# Patient Record
Sex: Female | Born: 1975 | Race: White | Hispanic: No | Marital: Single | Smoking: Current every day smoker
Health system: Southern US, Community
[De-identification: ages and names within clinical notes are randomized; demographics above are authoritative.]

## PROBLEM LIST (undated history)

## (undated) DIAGNOSIS — F988 Other specified behavioral and emotional disorders with onset usually occurring in childhood and adolescence: Secondary | ICD-10-CM

## (undated) HISTORY — DX: Other specified behavioral and emotional disorders with onset usually occurring in childhood and adolescence: F98.8

---

## 1998-04-05 ENCOUNTER — Emergency Department (HOSPITAL_COMMUNITY): Admission: EM | Admit: 1998-04-05 | Discharge: 1998-04-05 | Payer: Self-pay | Admitting: Emergency Medicine

## 2002-08-01 ENCOUNTER — Encounter: Payer: Self-pay | Admitting: *Deleted

## 2002-08-01 ENCOUNTER — Inpatient Hospital Stay (HOSPITAL_COMMUNITY): Admission: AD | Admit: 2002-08-01 | Discharge: 2002-08-01 | Payer: Self-pay | Admitting: *Deleted

## 2002-08-02 ENCOUNTER — Ambulatory Visit (HOSPITAL_COMMUNITY): Admission: RE | Admit: 2002-08-02 | Discharge: 2002-08-02 | Payer: Self-pay | Admitting: Obstetrics and Gynecology

## 2007-01-10 ENCOUNTER — Inpatient Hospital Stay (HOSPITAL_COMMUNITY): Admission: AD | Admit: 2007-01-10 | Discharge: 2007-01-27 | Payer: Self-pay | Admitting: Family Medicine

## 2008-06-13 IMAGING — US US OB COMP +14 WK
1 series · 14 of 28 positions shown · non-contrast
Comparison: none

OBSTETRICAL ULTRASOUND:

 This ultrasound exam was performed in the [HOSPITAL] Ultrasound Department.  The OB US report was generated in the AS system, and faxed to the ordering physician.  This report is also available in [REDACTED] PACS.

[Series 1: us ob comp +14 wk · 14 of 48 slices shown]
[im 2/48]
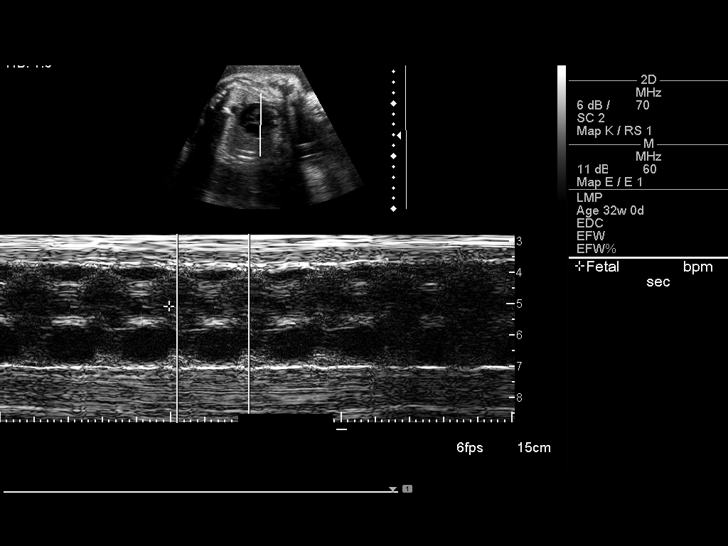
[im 6/48]
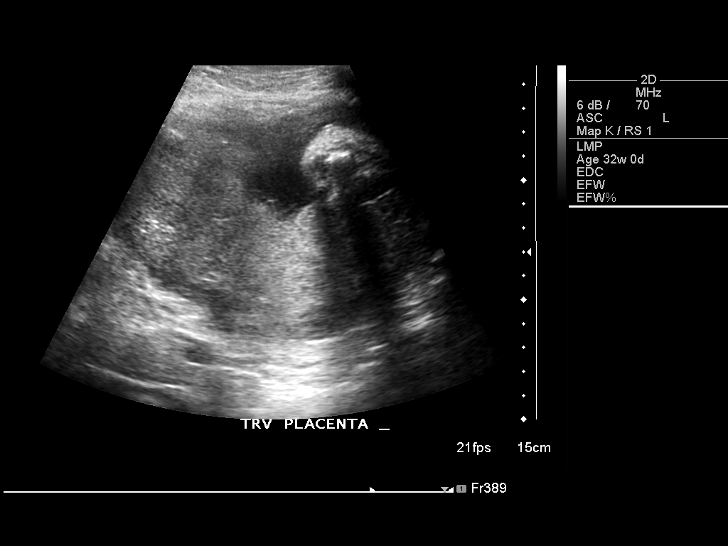
[im 9/48]
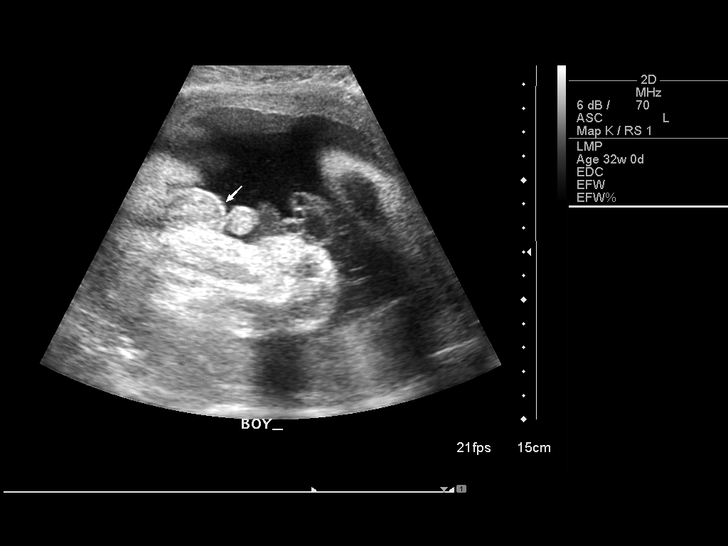
[im 13/48]
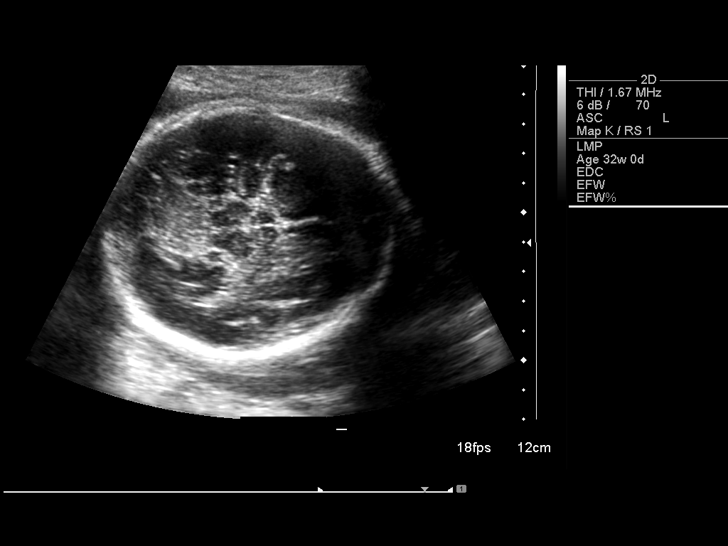
[im 16/48]
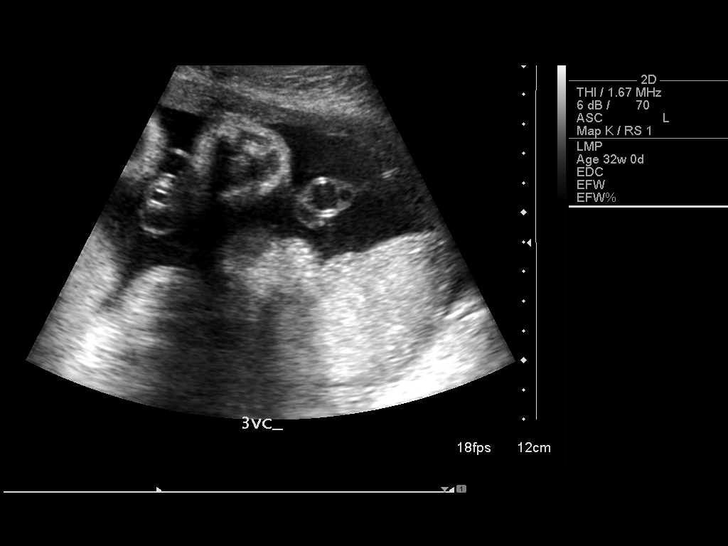
[im 20/48]
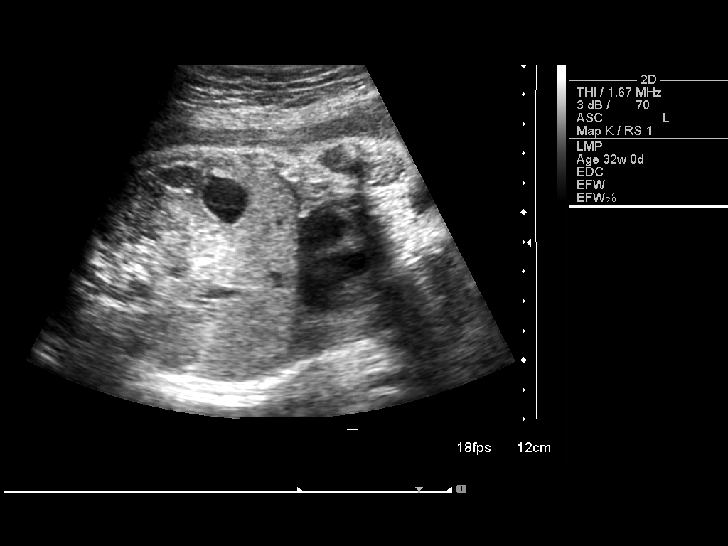
[im 23/48]
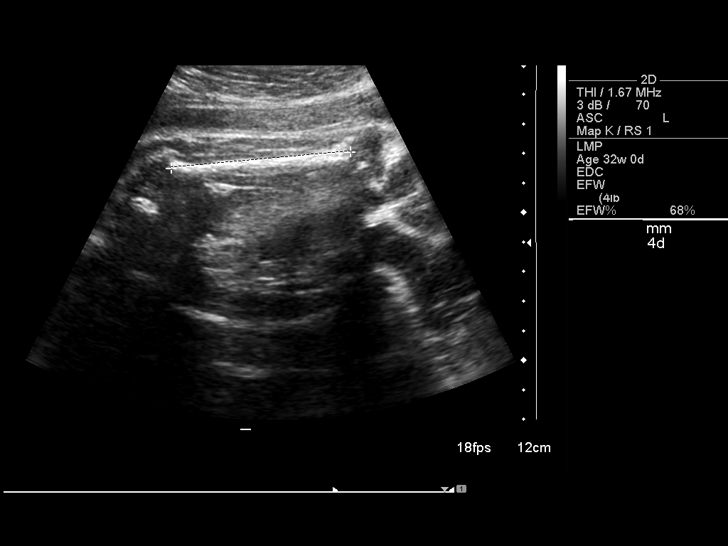
[im 27/48]
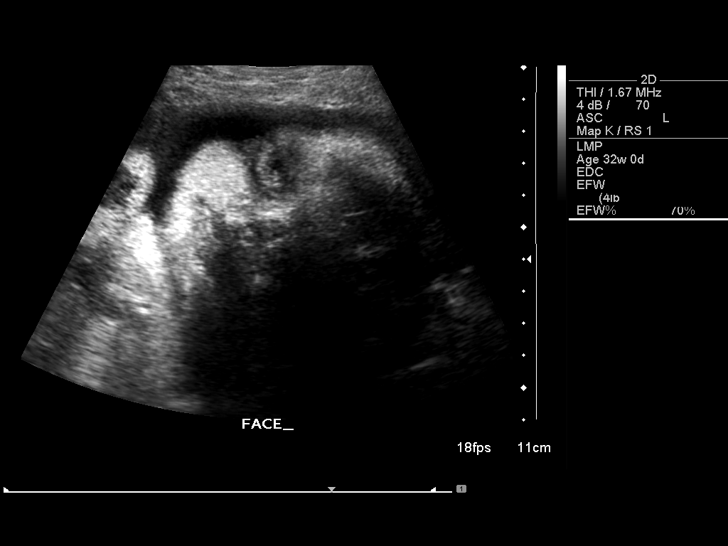
[im 30/48]
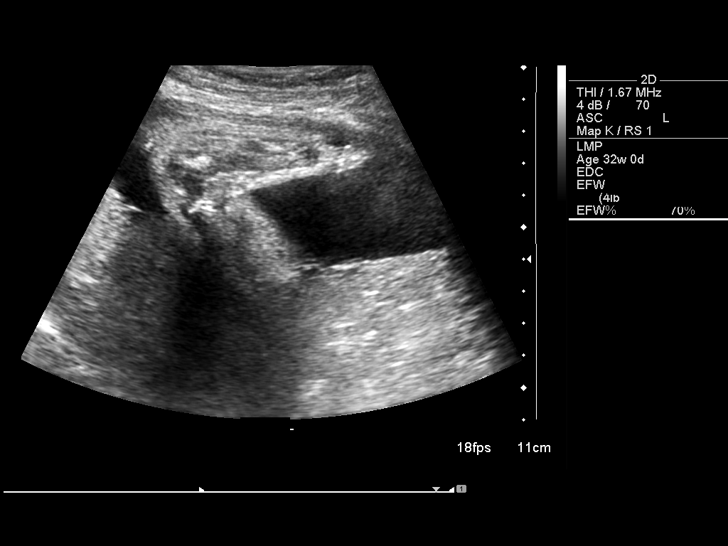
[im 34/48]
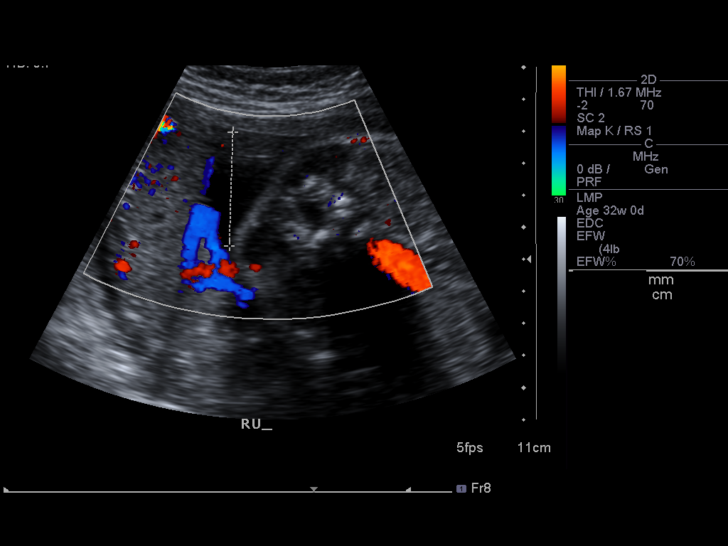
[im 37/48]
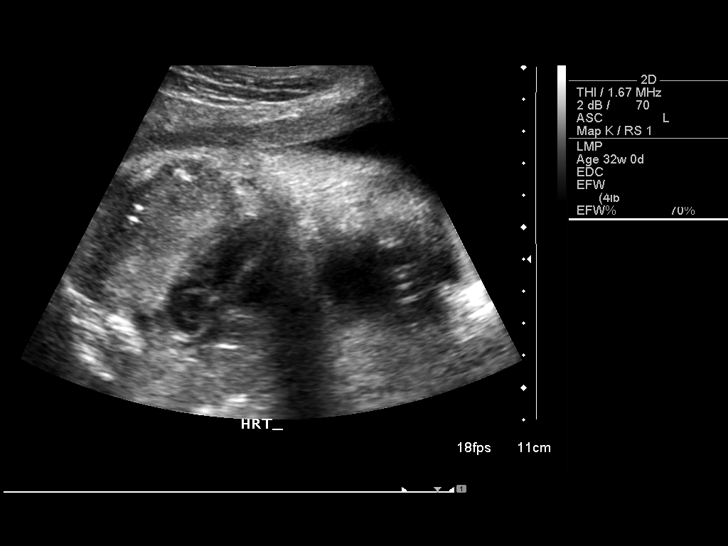
[im 41/48]
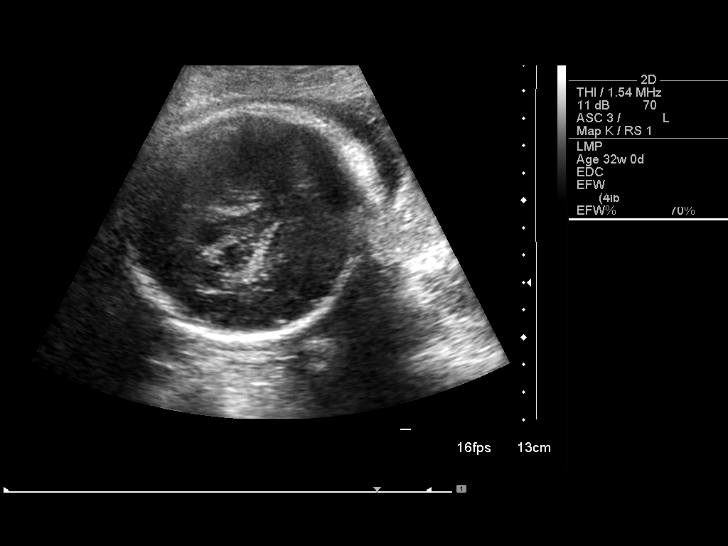
[im 44/48]
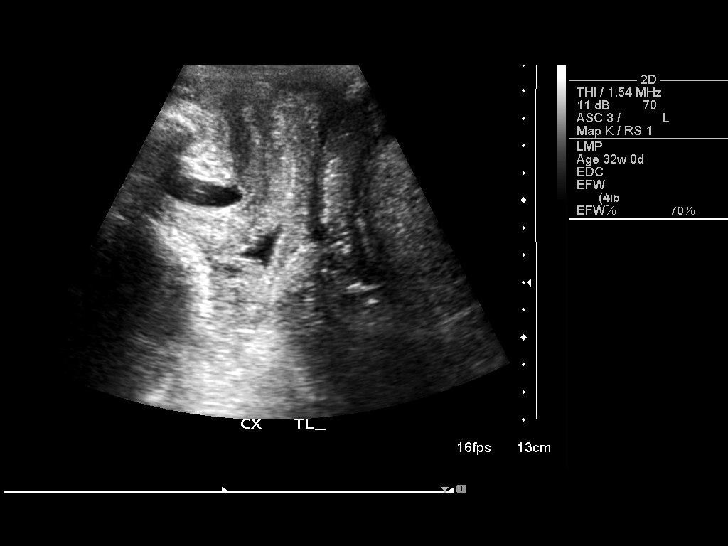
[im 48/48]
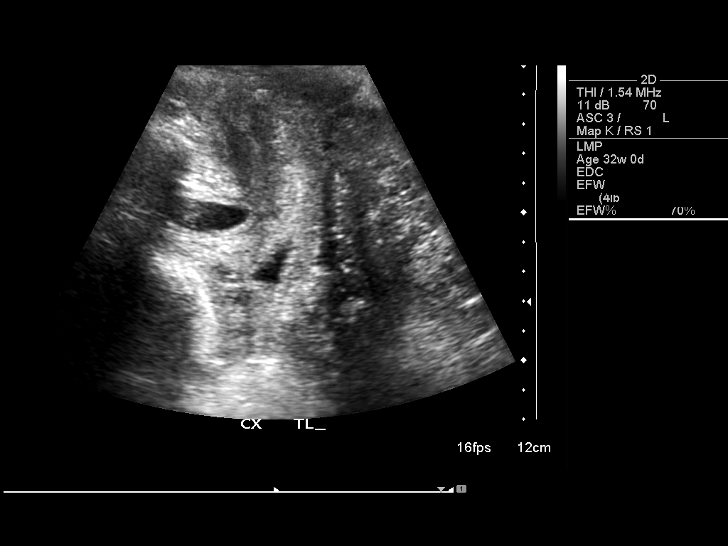

[14 of 28 positions shown; findings below may reference images not displayed]

IMPRESSION: See AS Obstetric US report.

## 2008-06-17 IMAGING — US US OB LIMITED
1 series · 14 of 28 positions shown · non-contrast
Comparison: none

OBSTETRICAL ULTRASOUND:

 This ultrasound exam was performed in the [HOSPITAL] Ultrasound Department.  The OB US report was generated in the AS system, and faxed to the ordering physician.  This report is also available in [REDACTED] PACS.

[Series 1: us ob limited · 0.26mm/px · 14 of 30 slices shown]
[im 2/30]
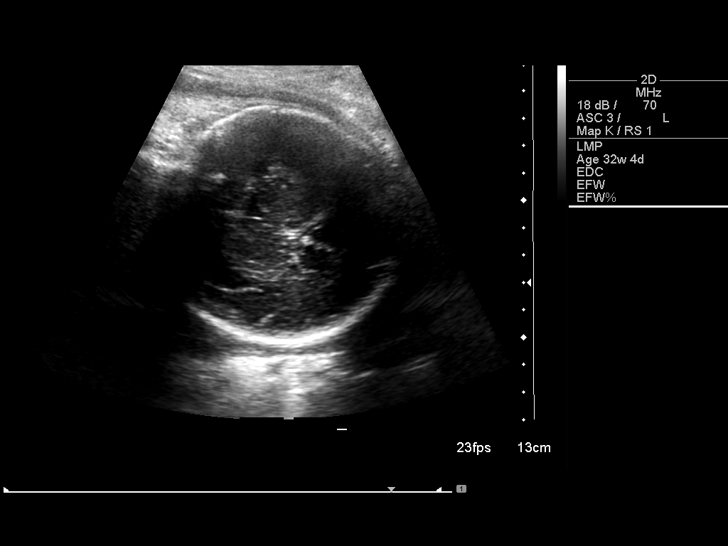
[im 4/30]
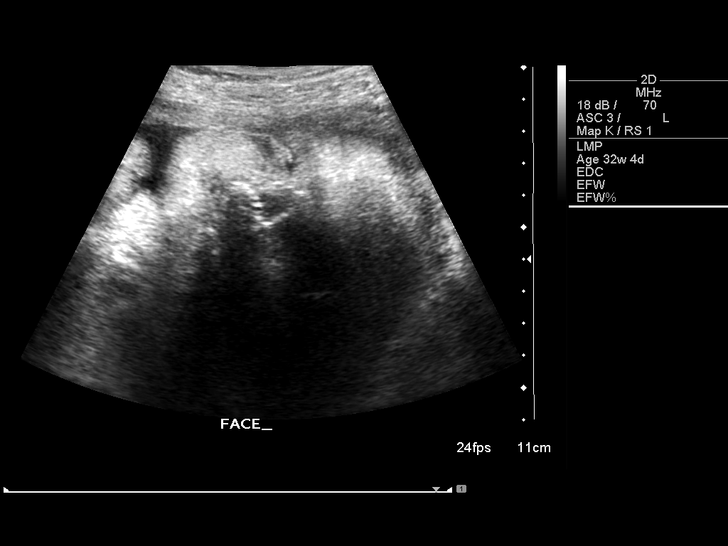
[im 6/30]
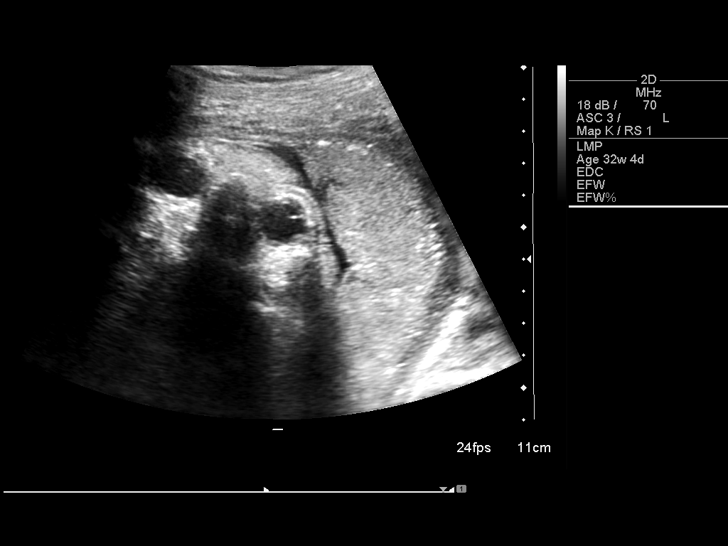
[im 8/30]
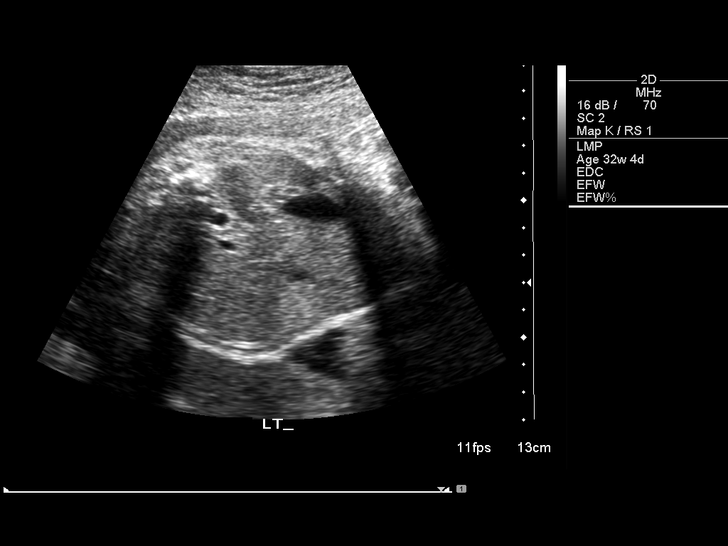
[im 10/30]
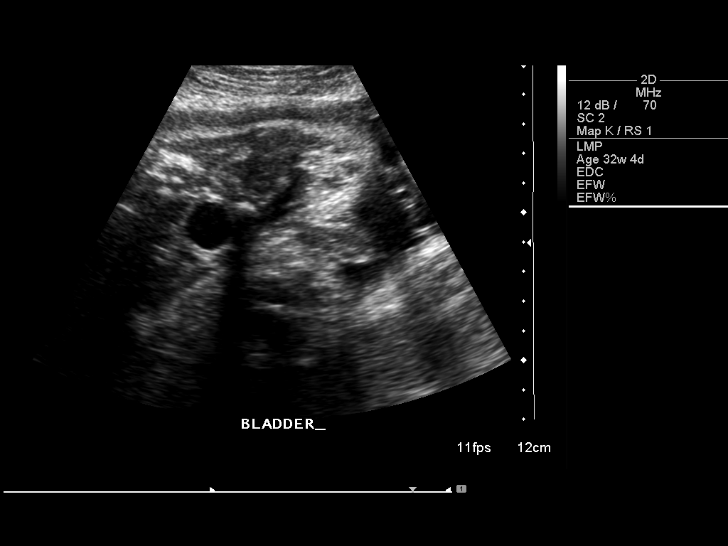
[im 12/30]
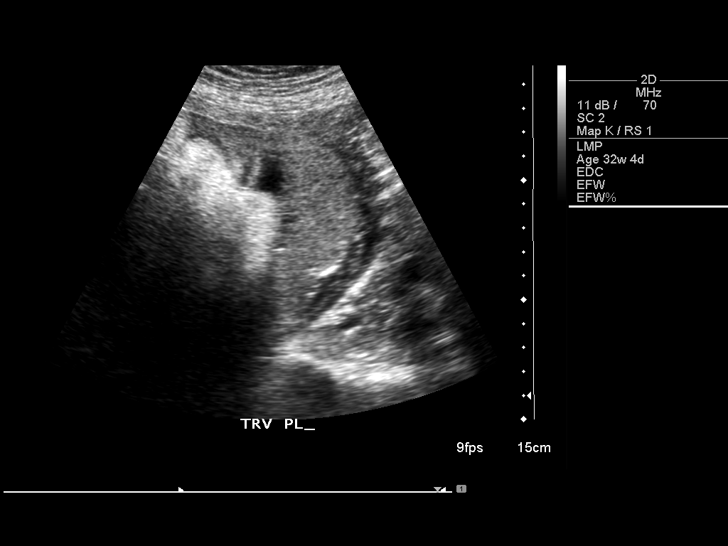
[im 14/30]
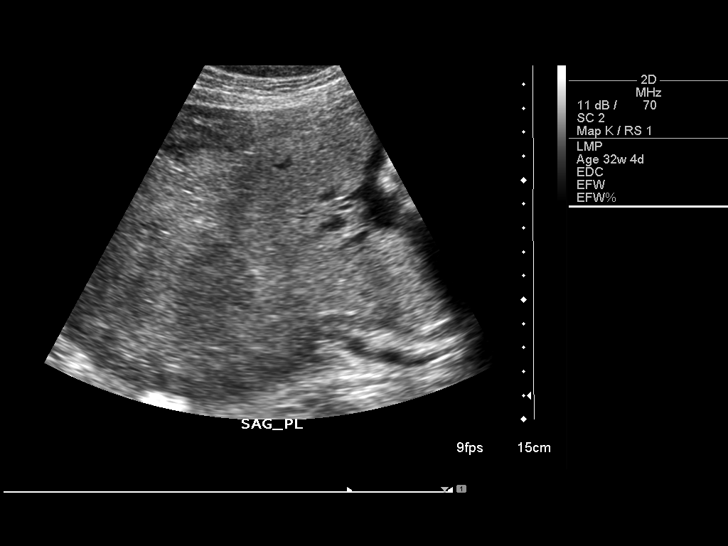
[im 17/30]
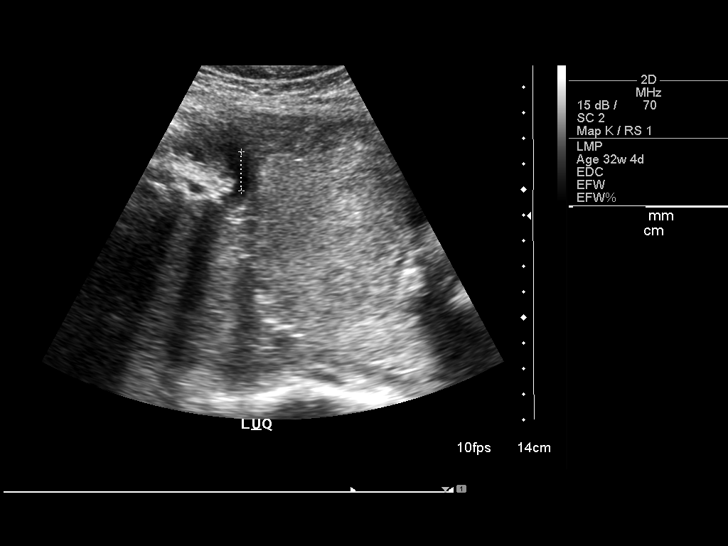
[im 19/30]
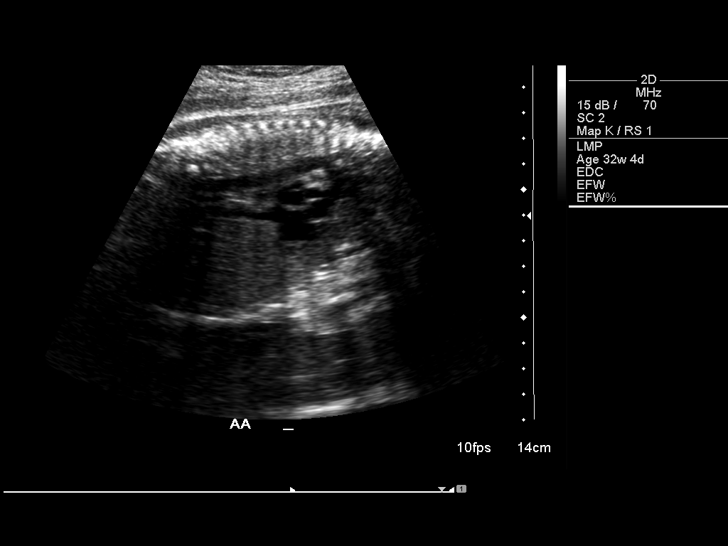
[im 21/30]
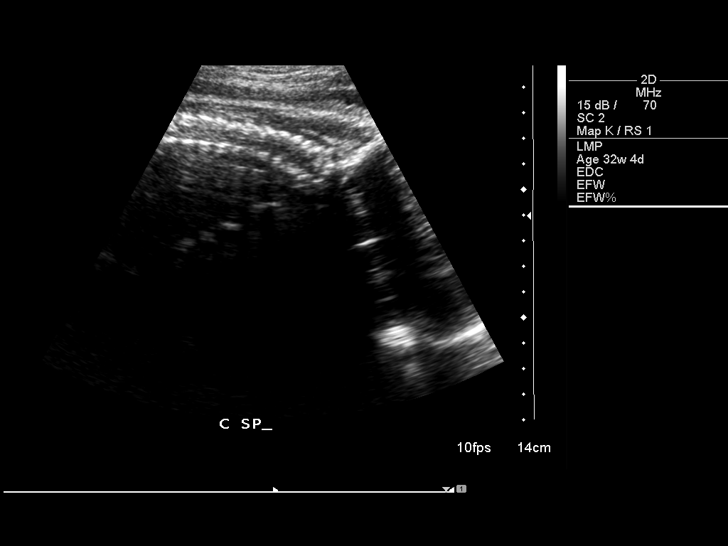
[im 23/30]
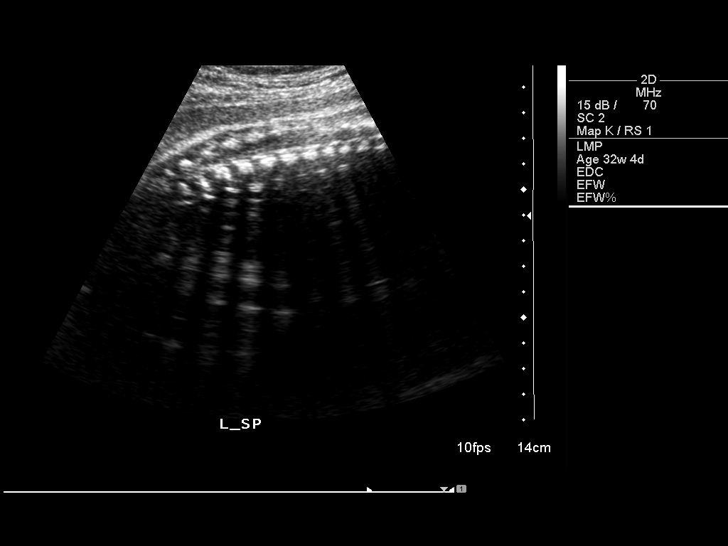
[im 25/30]
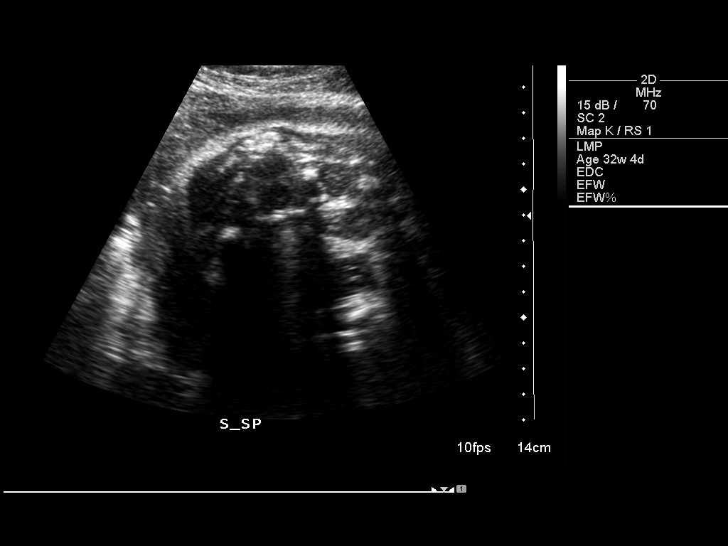
[im 27/30]
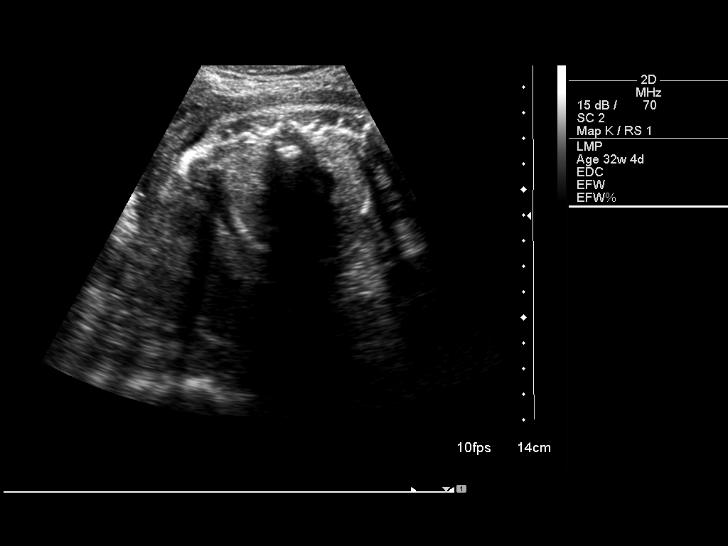
[im 30/30]
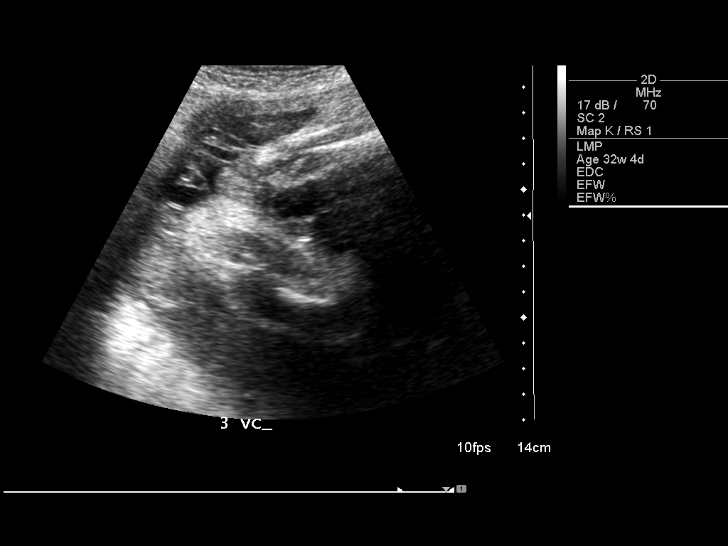

[14 of 28 positions shown; findings below may reference images not displayed]

IMPRESSION: See AS Obstetric US report.

## 2011-02-08 ENCOUNTER — Emergency Department (HOSPITAL_COMMUNITY)
Admission: EM | Admit: 2011-02-08 | Discharge: 2011-02-08 | Disposition: A | Discharge status: Home / Self Care | Payer: Self-pay | Attending: Emergency Medicine | Admitting: Emergency Medicine

## 2011-02-08 ENCOUNTER — Emergency Department (HOSPITAL_COMMUNITY): Payer: Self-pay

## 2011-02-08 DIAGNOSIS — Y92009 Unspecified place in unspecified non-institutional (private) residence as the place of occurrence of the external cause: Secondary | ICD-10-CM | POA: Insufficient documentation

## 2011-02-08 DIAGNOSIS — S62009A Unspecified fracture of navicular [scaphoid] bone of unspecified wrist, initial encounter for closed fracture: Secondary | ICD-10-CM | POA: Insufficient documentation

## 2011-02-08 DIAGNOSIS — W108XXA Fall (on) (from) other stairs and steps, initial encounter: Secondary | ICD-10-CM | POA: Insufficient documentation

## 2011-02-10 ENCOUNTER — Emergency Department (HOSPITAL_COMMUNITY): Payer: Self-pay

## 2011-02-10 ENCOUNTER — Emergency Department (HOSPITAL_COMMUNITY)
Admission: EM | Admit: 2011-02-10 | Discharge: 2011-02-10 | Disposition: A | Discharge status: Home / Self Care | Payer: Self-pay | Attending: Emergency Medicine | Admitting: Emergency Medicine

## 2011-02-10 DIAGNOSIS — W19XXXA Unspecified fall, initial encounter: Secondary | ICD-10-CM | POA: Insufficient documentation

## 2011-02-10 DIAGNOSIS — M79609 Pain in unspecified limb: Secondary | ICD-10-CM | POA: Insufficient documentation

## 2011-02-10 DIAGNOSIS — M7989 Other specified soft tissue disorders: Secondary | ICD-10-CM | POA: Insufficient documentation

## 2014-11-06 ENCOUNTER — Other Ambulatory Visit: Payer: Self-pay | Admitting: Physician Assistant

## 2014-11-06 ENCOUNTER — Ambulatory Visit
Admission: RE | Admit: 2014-11-06 | Discharge: 2014-11-06 | Disposition: A | Discharge status: Home / Self Care | Payer: Self-pay | Source: Ambulatory Visit | Attending: Physician Assistant | Admitting: Physician Assistant

## 2014-11-06 DIAGNOSIS — M79672 Pain in left foot: Secondary | ICD-10-CM

## 2016-12-25 ENCOUNTER — Ambulatory Visit: Payer: Self-pay

## 2016-12-26 ENCOUNTER — Ambulatory Visit: Payer: Self-pay

## 2017-01-14 ENCOUNTER — Ambulatory Visit (INDEPENDENT_AMBULATORY_CARE_PROVIDER_SITE_OTHER): Payer: Self-pay | Admitting: Family Medicine

## 2017-01-14 VITALS — BP 159/64 | HR 94 | Temp 98.5°F | Resp 16 | Ht 61.0 in | Wt 151.0 lb

## 2017-01-14 DIAGNOSIS — F411 Generalized anxiety disorder: Secondary | ICD-10-CM

## 2017-01-14 DIAGNOSIS — F909 Attention-deficit hyperactivity disorder, unspecified type: Secondary | ICD-10-CM

## 2017-01-14 MED ORDER — VENLAFAXINE HCL ER 150 MG PO CP24: 150.0000 mg | ORAL_CAPSULE | Freq: Every day | ORAL | 0 refills | Status: DC

## 2017-01-14 MED ORDER — AMPHETAMINE-DEXTROAMPHETAMINE 20 MG PO TABS: 20.0000 mg | ORAL_TABLET | Freq: Every day | ORAL | 0 refills | Status: DC

## 2017-01-14 MED ORDER — ATOMOXETINE HCL 25 MG PO CAPS: ORAL_CAPSULE | ORAL | 0 refills | Status: DC

## 2017-01-14 NOTE — Progress Notes (Signed)
Patient ID: Crystal Webb, female    DOB: [birth year]  Age: 41 y.o. MRN: [id number]  Chief Complaint  Patient presents with  . new pt  . Medication Refill    Adderall 20 mg BID    Subjective:   Patient is here today for first time. She has been on ADHD medications that she was about 41 years old, and is here for a refill of her Adderall. Her regular physician was at [facility] and has left. She was placed on Strattera several months ago but she feels like it makes her more agitated and she is wishing to wean off of it. She's been on Effexor for a long time for anxiety and has done well with that.  She has been on these without difficulties. She wants a primary physician who will prescribe her the regular medications. She has requested that her records from the last physician be transferred over to this office, though I cannot find them at this time.  She is otherwise healthy.  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 If she doesn't take her medication she does not get tasks finished. She is a mother of 43 year old, a single mom. Her adoptive father, who is now divorced from her mother, was a family physician. [place] for many years.  Current allergies, medications, problem list, past/family and social histories reviewed.  Objective:  BP (!) 159/64   Pulse 94   Temp 98.5 F (36.9 C) (Oral)   Resp 16   Ht  (1.549 m)   Wt 151 lb (68.5 kg)   LMP 01/07/2017   SpO2 98%   BMI 28.53 kg/m   Physical exam not done today  Assessment & Plan:   Assessment: 1. Attention deficit hyperactivity disorder (ADHD), unspecified ADHD type   2. Anxiety state       Plan: We will try to get her established with Trena Platt PA-C for her primary. Also informed her that the primary will be the only one who will prescribe controlled substances to her. She needs to check enough ahead of time to make sure that she can see Judeth Cornfield for her refills.  No orders  of the defined types were placed in this encounter.   Meds ordered this encounter  Medications  . DISCONTD: atomoxetine (STRATTERA) 40 MG capsule    Sig: Take 40 mg by mouth daily.  Marland Kitchen venlafaxine XR (EFFEXOR-XR) 150 MG 24 hr capsule    Sig: Take 150 mg by mouth daily with breakfast.  . amphetamine-dextroamphetamine (ADDERALL) 20 MG tablet    Sig: Take 20 mg by mouth daily.         Patient Instructions   Continue to take the Adderall daily  Decrease the Strattera to 25 mg daily for 1 week, then every other day for a couple of weeks, then discontinue it. If you have problems from this get back to [place].  Continue taking the venlafaxine X are 150 mg 1 daily.  Return at anytime if needed, but make an appointment for before your medicines run out in 60 days.    IF you received an x-ray today, you will receive an invoice from [facility]. Please contact [facility] at [phone] with questions or concerns regarding your invoice.   IF you received labwork today, you will receive an invoice from [place]. Please contact [facility] at [phone] with questions or concerns regarding your invoice.   Our billing staff will not be able to assist you with questions regarding bills from  these companies.  You will be contacted with the lab results as soon as they are available. The fastest way to get your results is to activate your My Chart account. Instructions are located on the last page of this paperwork. If you have not heard from [place] regarding the results in 2 weeks, please contact this office.        No Follow-up on file.   HOPPER,DAVID, MD 01/14/2017

## 2017-01-14 NOTE — Patient Instructions (Addendum)
Continue to take the Adderall daily  Decrease the Strattera to 25 mg daily for 1 week, then every other day for a couple of weeks, then discontinue it. If you have problems from this get back to [place].  Continue taking the venlafaxine X are 150 mg 1 daily.  Return at anytime if needed, but make an appointment for before your medicines run out in 60 days.    IF you received an x-ray today, you will receive an invoice from [facility]. Please contact [facility] at [phone] with questions or concerns regarding your invoice.   IF you received labwork today, you will receive an invoice from [place]. Please contact [facility] at [phone] with questions or concerns regarding your invoice.   Our billing staff will not be able to assist you with questions regarding bills from these companies.  You will be contacted with the lab results as soon as they are available. The fastest way to get your results is to activate your My Chart account. Instructions are located on the last page of this paperwork. If you have not heard from [place] regarding the results in 2 weeks, please contact this office.

## 2017-02-06 ENCOUNTER — Ambulatory Visit: Payer: Self-pay | Admitting: Physician Assistant

## 2017-02-11 ENCOUNTER — Ambulatory Visit: Payer: Self-pay | Admitting: Physician Assistant

## 2017-02-12 ENCOUNTER — Ambulatory Visit (INDEPENDENT_AMBULATORY_CARE_PROVIDER_SITE_OTHER): Payer: Self-pay | Admitting: Physician Assistant

## 2017-02-12 ENCOUNTER — Encounter: Payer: Self-pay | Admitting: Physician Assistant

## 2017-02-12 VITALS — BP 138/90 | HR 94 | Temp 99.5°F | Resp 16 | Ht 62.0 in | Wt 149.2 lb

## 2017-02-12 DIAGNOSIS — F909 Attention-deficit hyperactivity disorder, unspecified type: Secondary | ICD-10-CM

## 2017-02-12 DIAGNOSIS — F411 Generalized anxiety disorder: Secondary | ICD-10-CM

## 2017-02-12 MED ORDER — AMPHETAMINE-DEXTROAMPHETAMINE 20 MG PO TABS: 20.0000 mg | ORAL_TABLET | Freq: Every day | ORAL | 0 refills | Status: DC

## 2017-02-12 MED ORDER — AMPHETAMINE-DEXTROAMPHET ER 20 MG PO CP24: 20.0000 mg | ORAL_CAPSULE | Freq: Every day | ORAL | 0 refills | Status: DC

## 2017-02-12 MED ORDER — VENLAFAXINE HCL ER 150 MG PO CP24: 150.0000 mg | ORAL_CAPSULE | Freq: Every day | ORAL | 0 refills | Status: DC

## 2017-02-12 NOTE — Progress Notes (Signed)
[facility] [address], [place] [address] [phone]  Date:  02/12/2017   Name:  Crystal Webb Sharp   DOB:  04-03-1976   MRN:  [id number]  PCP:  Garnetta BuddyEnglish, Stephanie D, PA    History of Present Illness:  Crystal Webb Segers is a 41 y.o. female patient who presents to PCP with  Chief Complaint  Patient presents with  . Medication Refill    Adderall 20 mg, Effexor-XR 150 mg      [employer] transport with data entry.  She has struggles completing projects and tasks during a stint of not taking adderall.  She was going a month without taking the medications.   [facility], hwoever did not want to continue care at the clinic.   She had cloudy head and vertigo.   She takes adderall in the morning with breakfast.  Works until 6 pm.  She will take another dose at lunchtime.  No palpations or stomach.   She no longer takes the strattera--as this did gave her insomnia and increased her agitation.   She has trouble keeping track of her things without the medication. Attention to detail was lacking, focus and sitting still long enough to complete her work projects.    There are no active problems to display for this patient.   No past medical history on file.  No past surgical history on file.  Social History  Substance Use Topics  . Smoking status: Current Every Day Smoker  . Smokeless tobacco: Never Used  . Alcohol use No    Family History  Problem Relation Age of Onset  . Stroke Mother     No Known Allergies  Medication list has been reviewed and updated.  Current Outpatient Prescriptions on File Prior to Visit  Medication Sig Dispense Refill  . amphetamine-dextroamphetamine (ADDERALL) 20 MG tablet Take 1 tablet (20 mg total) by mouth daily. 60 tablet 0  . venlafaxine XR (EFFEXOR-XR) 150 MG 24 hr capsule Take 1 capsule (150 mg total) by mouth daily with breakfast. 60 capsule 0  . atomoxetine (STRATTERA) 25 MG capsule Take 1 daily for 1 week, then  take one every other day for 2 weeks, then discontinue (Patient not taking: Reported on 02/12/2017) 14 capsule 0   No current facility-administered medications on file prior to visit.     ROS ROS otherwise unremarkable unless listed above.  Physical Examination: BP 138/90 (BP Location: Left Arm, Cuff Size: Normal)   Pulse 94   Temp 99.5 F (37.5 C) (Oral)   Resp 16   Ht 5\' 2"  (1.575 m)   Wt 149 lb 3.2 oz (67.7 kg)   LMP 02/09/2017   SpO2 97%   BMI 27.29 kg/m  Ideal Body Weight: Weight in (lb) to have BMI = 25: 136.4  Physical Exam  Constitutional: She is oriented to person, place, and time. She appears well-developed and well-nourished. No distress.  HENT:  Head: Normocephalic and atraumatic.  Right Ear: External ear normal.  Left Ear: External ear normal.  Eyes: Conjunctivae and EOM are normal. Pupils are equal, round, and reactive to light.  Cardiovascular: Normal rate.   Pulmonary/Chest: Effort normal. No respiratory distress.  Neurological: She is alert and oriented to person, place, and time.  Skin: She is not diaphoretic.  Psychiatric: She has a normal mood and affect. Her behavior is normal.     Assessment and Plan: Crystal Webb Heino is a 41 y.o. female who is here today for cc of refill of the  adderall. To offer longer coverage, we will change morning dose to ER, with IR afternoon Advised to follow up in 1 month to see how this is. Refilling anxiety medicine Attention deficit hyperactivity disorder (ADHD), unspecified ADHD type - Plan: amphetamine-dextroamphetamine (ADDERALL XR) 20 MG 24 hr capsule, amphetamine-dextroamphetamine (ADDERALL) 20 MG tablet  Anxiety state - Plan: venlafaxine XR (EFFEXOR-XR) 150 MG 24 hr capsule

## 2017-02-12 NOTE — Patient Instructions (Addendum)
  Please return in 1 month.     IF you received an x-ray today, you will receive an invoice from [facility]. Please contact [facility] at [phone] with questions or concerns regarding your invoice.   IF you received labwork today, you will receive an invoice from [facility]. Please contact [facility] at [phone] with questions or concerns regarding your invoice.   Our billing staff will not be able to assist you with questions regarding bills from these companies.  You will be contacted with the lab results as soon as they are available. The fastest way to get your results is to activate your My Chart account. Instructions are located on the last page of this paperwork. If you have not heard from us regarding the results in 2 weeks, please contact this office.

## 2017-03-04 ENCOUNTER — Encounter: Payer: Self-pay | Admitting: Physician Assistant

## 2017-03-04 ENCOUNTER — Ambulatory Visit (INDEPENDENT_AMBULATORY_CARE_PROVIDER_SITE_OTHER): Payer: Self-pay | Admitting: Physician Assistant

## 2017-03-04 DIAGNOSIS — F909 Attention-deficit hyperactivity disorder, unspecified type: Secondary | ICD-10-CM

## 2017-03-04 MED ORDER — AMPHETAMINE-DEXTROAMPHET ER 20 MG PO CP24: 20.0000 mg | ORAL_CAPSULE | Freq: Every day | ORAL | 0 refills | Status: DC

## 2017-03-04 MED ORDER — AMPHETAMINE-DEXTROAMPHETAMINE 20 MG PO TABS: 20.0000 mg | ORAL_TABLET | Freq: Every day | ORAL | 0 refills | Status: DC

## 2017-03-04 NOTE — Patient Instructions (Signed)
     IF you received an x-ray today, you will receive an invoice from [facility]. Please contact [facility] at [phone] with questions or concerns regarding your invoice.   IF you received labwork today, you will receive an invoice from [facility]. Please contact [facility] at [phone] with questions or concerns regarding your invoice.   Our billing staff will not be able to assist you with questions regarding bills from these companies.  You will be contacted with the lab results as soon as they are available. The fastest way to get your results is to activate your My Chart account. Instructions are located on the last page of this paperwork. If you have not heard from us regarding the results in 2 weeks, please contact this office.     

## 2017-03-07 NOTE — Progress Notes (Signed)
[facility] [address], [place] [address] [phone]  Date:  03/04/2017   Name:  Crystal Webb   DOB:  [birth year]   MRN:  [id number]  PCP:  Garnetta BuddyEnglish, Tasha Jindra D, PA    History of Present Illness:  Crystal Webb is a 41 y.o. female patient who presents to PCP with  Chief Complaint  Patient presents with  . Medication Refill     --patient is here for medication refill of the adderall --she was restarted, new to our practice.   --she reports that the 1 month, she has taken the medication, she reports no side effects --normal appetite.  No palpitations. --she generally takes the extended release early AM and the 2nd at 1512.   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   History reviewed. No pertinent past medical history.  History reviewed. No pertinent surgical history.  Social History  Substance Use Topics  . Smoking status: Current Every Day Smoker  . Smokeless tobacco: Never Used  . Alcohol use No    Family History  Problem Relation Age of Onset  . Stroke Mother     No Known Allergies  Medication list has been reviewed and updated.  Current Outpatient Prescriptions on File Prior to Visit  Medication Sig Dispense Refill  . venlafaxine XR (EFFEXOR-XR) 150 MG 24 hr capsule Take 1 capsule (150 mg total) by mouth daily with breakfast. 30 capsule 0   No current facility-administered medications on file prior to visit.     ROS ROS otherwise unremarkable unless listed above.  Physical Examination: BP 130/80 (BP Location: Right Arm, Patient Position: Sitting, Cuff Size: Normal)   Pulse (!) 101   Temp 98.7 F (37.1 C) (Oral)   Resp 18   Ht 5' 1.97" (1.574 m)   Wt 149 lb (67.6 kg)   LMP 02/25/2017 (Approximate)   SpO2 99%   BMI 27.28 kg/m  Ideal Body Weight: Weight in (lb) to have BMI = 25: 136.3  Physical Exam   Constitutional: She is oriented to person, place, and time. She appears well-developed and well-nourished. No distress.  HENT:  Head: Normocephalic and atraumatic.  Right Ear: External ear normal.  Left Ear: External ear normal.  Eyes: Conjunctivae and EOM are normal. Pupils are equal, round, and reactive to light.  Cardiovascular: Normal rate.   Pulmonary/Chest: Effort normal. No respiratory distress.  Neurological: She is alert and oriented to person, place, and time.  Skin: She is not diaphoretic.  Psychiatric: She has a normal mood and affect. Her behavior is normal.     Assessment and Plan: Crystal Webb is a 41 y.o. female who is here today for medication refill Ok to fill for 3 months.   1. Attention deficit hyperactivity disorder (ADHD), unspecified ADHD type - amphetamine-dextroamphetamine (ADDERALL XR) 20 MG 24 hr capsule; Take 1 capsule (20 mg total) by mouth daily.  Dispense: 30 capsule; Refill: 0 - amphetamine-dextroamphetamine (ADDERALL) 20 MG tablet; Take 1 tablet (20 mg total) by mouth daily before lunch.  Dispense: 30 tablet; Refill: 0   Trena PlattStephanie Maeson Lourenco, PA-C [facility] 03/07/2017 8:11 AM

## 2017-03-17 ENCOUNTER — Ambulatory Visit: Payer: Self-pay | Admitting: Physician Assistant

## 2017-05-13 ENCOUNTER — Encounter: Payer: Self-pay | Admitting: Physician Assistant

## 2017-05-13 ENCOUNTER — Ambulatory Visit (INDEPENDENT_AMBULATORY_CARE_PROVIDER_SITE_OTHER): Payer: Self-pay | Admitting: Physician Assistant

## 2017-05-13 DIAGNOSIS — F909 Attention-deficit hyperactivity disorder, unspecified type: Secondary | ICD-10-CM

## 2017-05-13 DIAGNOSIS — F411 Generalized anxiety disorder: Secondary | ICD-10-CM

## 2017-05-13 MED ORDER — VENLAFAXINE HCL ER 150 MG PO CP24: 150.0000 mg | ORAL_CAPSULE | Freq: Every day | ORAL | 1 refills | Status: DC

## 2017-05-13 MED ORDER — AMPHETAMINE-DEXTROAMPHET ER 20 MG PO CP24: 20.0000 mg | ORAL_CAPSULE | Freq: Every day | ORAL | 0 refills | Status: DC

## 2017-05-13 MED ORDER — AMPHETAMINE-DEXTROAMPHETAMINE 20 MG PO TABS: 20.0000 mg | ORAL_TABLET | Freq: Every day | ORAL | 0 refills | Status: DC

## 2017-05-13 NOTE — Patient Instructions (Signed)
     IF you received an x-ray today, you will receive an invoice from [facility]. Please contact [facility] at [phone] with questions or concerns regarding your invoice.   IF you received labwork today, you will receive an invoice from [facility]. Please contact [facility] at [phone] with questions or concerns regarding your invoice.   Our billing staff will not be able to assist you with questions regarding bills from these companies.  You will be contacted with the lab results as soon as they are available. The fastest way to get your results is to activate your My Chart account. Instructions are located on the last page of this paperwork. If you have not heard from us regarding the results in 2 weeks, please contact this office.     

## 2017-05-16 NOTE — Progress Notes (Signed)
[facility] [address], [place] [address] [phone]  Date:  05/13/2017   Name:  Crystal Webb   DOB:  03/30/1976   MRN:  [id number]  PCP:  Garnetta BuddyEnglish, Romano Stigger D, PA    History of Present Illness:  Crystal Webb is a 41 y.o. female patient who presents to PCP with  Chief Complaint  Patient presents with  . ADHD    follow-up/med refill     Patient is here for follow up of adhd.   She is 3 weeks early.  [place] is [place] the [place]  Requesting refills at this time.   She has no reported side effects to the medication.   She would like refill of the effexor.  There are no active problems to display for this patient.   History reviewed. No pertinent past medical history.  History reviewed. No pertinent surgical history.  Social History  Substance Use Topics  . Smoking status: Current Every Day Smoker  . Smokeless tobacco: Never Used  . Alcohol use No    Family History  Problem Relation Age of Onset  . Stroke Mother     No Known Allergies  Medication list has been reviewed and updated.  No current outpatient prescriptions on file prior to visit.   No current facility-administered medications on file prior to visit.     ROS ROS otherwise unremarkable unless listed above.  Physical Examination: BP 126/85   Pulse (!) 103   Temp 98.4 F (36.9 C) (Oral)   Resp 18   Ht 5' 1.81" (1.57 m)   Wt 148 lb (67.1 kg)   LMP 05/06/2017 (Approximate)   SpO2 98%   BMI 27.24 kg/m  Ideal Body Weight: Weight in (lb) to have BMI = 25: 135.6  Physical Exam  Constitutional: She is oriented to person, place, and time. She appears well-developed and well-nourished. No distress.  HENT:  Head: Normocephalic and atraumatic.  Right Ear: External ear normal.  Left Ear: External ear normal.  Eyes: Pupils are equal, round, and reactive to light. Conjunctivae and EOM are normal.  Cardiovascular: Normal rate.   Pulmonary/Chest:  Effort normal. No respiratory distress.  Neurological: She is alert and oriented to person, place, and time.  Skin: She is not diaphoretic.  Psychiatric: She has a normal mood and affect. Her behavior is normal.     Assessment and Plan: Crystal Webb is a 41 y.o. female who is here today for adhd medication refill. F/u in 3 months.   Attention deficit hyperactivity disorder (ADHD), unspecified ADHD type - Plan: amphetamine-dextroamphetamine (ADDERALL XR) 20 MG 24 hr capsule  Anxiety state - Plan: venlafaxine XR (EFFEXOR-XR) 150 MG 24 hr capsule  Trena PlattStephanie Sudiksha Victor, PA-C [facility] 8/12/20187:49 PM

## 2017-06-09 ENCOUNTER — Ambulatory Visit: Payer: Self-pay | Admitting: Physician Assistant

## 2017-06-21 ENCOUNTER — Telehealth: Payer: Self-pay | Admitting: Family Medicine

## 2017-06-21 NOTE — Telephone Encounter (Signed)
Patient came into clinic to see if refill was ready for pickup I explained our policy of 24-72 hours for refill request.  She would like to be called at [phone] when it is ready to be picked up

## 2017-06-22 NOTE — Telephone Encounter (Signed)
Please give more information, and whether this needs to be filled.

## 2017-06-22 NOTE — Telephone Encounter (Signed)
PATIENT CALLED BACK TO GET THE STATUS OF HER REFILL REQUEST. SHE NEEDS TO GET A PRESCRIPTION WRITTEN FOR HER ADDERALL 20 MG. SHE DOES NOT NEED THE EXTENDED RELEASE. BEST PHONE [phone] (CELL) SHE SAID THIS PHONE NUMBER IS NOT DISCONNECTED. MBC

## 2017-06-22 NOTE — Telephone Encounter (Signed)
Number disconnected. Letter sent.

## 2017-06-22 NOTE — Telephone Encounter (Signed)
Please advise 

## 2017-06-25 MED ORDER — AMPHETAMINE-DEXTROAMPHETAMINE 20 MG PO TABS: 20.0000 mg | ORAL_TABLET | Freq: Every day | ORAL | 0 refills | Status: DC

## 2017-06-25 NOTE — Telephone Encounter (Signed)
Please refill medication if appropriate. Thanks

## 2017-06-25 NOTE — Telephone Encounter (Signed)
Yes ok to fill the regular adderall  for 30 days. I am not in the office, can we get this to her.

## 2017-06-28 ENCOUNTER — Telehealth: Payer: Self-pay | Admitting: *Deleted

## 2017-06-28 ENCOUNTER — Other Ambulatory Visit: Payer: Self-pay | Admitting: Physician Assistant

## 2017-06-28 MED ORDER — AMPHETAMINE-DEXTROAMPHETAMINE 20 MG PO TABS: 20.0000 mg | ORAL_TABLET | Freq: Every day | ORAL | 0 refills | Status: DC

## 2017-06-28 NOTE — Telephone Encounter (Signed)
Patient advised prescription ready for pick up

## 2017-07-28 ENCOUNTER — Ambulatory Visit: Payer: Medicaid Other | Admitting: Physician Assistant

## 2017-07-29 ENCOUNTER — Other Ambulatory Visit: Payer: Self-pay | Admitting: Physician Assistant

## 2017-07-29 ENCOUNTER — Ambulatory Visit: Payer: Medicaid Other | Admitting: Physician Assistant

## 2017-07-29 ENCOUNTER — Ambulatory Visit: Payer: Self-pay | Admitting: Physician Assistant

## 2017-07-29 DIAGNOSIS — F909 Attention-deficit hyperactivity disorder, unspecified type: Secondary | ICD-10-CM

## 2017-07-29 MED ORDER — AMPHETAMINE-DEXTROAMPHET ER 20 MG PO CP24: 20.0000 mg | ORAL_CAPSULE | Freq: Every day | ORAL | 0 refills | Status: DC

## 2017-07-29 MED ORDER — AMPHETAMINE-DEXTROAMPHETAMINE 20 MG PO TABS: 20.0000 mg | ORAL_TABLET | Freq: Every day | ORAL | 0 refills | Status: DC

## 2017-07-29 NOTE — Progress Notes (Deleted)
[facility] [address], [place] [address] [phone]  Date:  07/29/2017   Name:  Crystal Webb   DOB:  [birth year]   MRN:  [id number]  PCP:  Garnetta BuddyEnglish, Roslin Norwood D, PA    History of Present Illness:  Crystal Webb is a 41 y.o. female patient who presents to PCP with No chief complaint on file.      There are no active problems to display for this patient.   No past medical history on file.  No past surgical history on file.  Social History  Substance Use Topics  . Smoking status: Current Every Day Smoker  . Smokeless tobacco: Never Used  . Alcohol use No    Family History  Problem Relation Age of Onset  . Stroke Mother     No Known Allergies  Medication list has been reviewed and updated.  Current Outpatient Prescriptions on File Prior to Visit  Medication Sig Dispense Refill  . venlafaxine XR (EFFEXOR-XR) 150 MG 24 hr capsule Take 1 capsule (150 mg total) by mouth daily with breakfast. 90 capsule 1   No current facility-administered medications on file prior to visit.     ROS ROS otherwise unremarkable unless listed above.  Physical Examination: There were no vitals taken for this visit. Ideal Body Weight:    Physical Exam   Assessment and Plan: Crystal Webb is a 41 y.o. female who is here today  1. Attention deficit hyperactivity disorder (ADHD), unspecified ADHD type - amphetamine-dextroamphetamine (ADDERALL XR) 20 MG 24 hr capsule; Take 1 capsule (20 mg total) by mouth daily.  Dispense: 30 capsule; Refill: 0 - amphetamine-dextroamphetamine (ADDERALL) 20 MG tablet; Take 1 tablet (20 mg total) by mouth daily.  Dispense: 30 tablet; Refill: 0 - amphetamine-dextroamphetamine (ADDERALL XR) 20 MG 24 hr capsule; Take 1 capsule (20 mg total) by mouth daily. Please fill after 30 days of last refill.  Dispense: 30 capsule; Refill: 0 - amphetamine-dextroamphetamine (ADDERALL XR) 20 MG 24 hr capsule; Take 1 capsule (20 mg total) by mouth  daily. Please fill after 60 days.  Dispense: 30 capsule; Refill: 0   Trena PlattStephanie Gari Trovato, PA-C [facility] 07/29/2017 4:32 PM

## 2017-09-16 ENCOUNTER — Telehealth: Payer: Self-pay | Admitting: Physician Assistant

## 2017-09-16 DIAGNOSIS — F909 Attention-deficit hyperactivity disorder, unspecified type: Secondary | ICD-10-CM

## 2017-09-16 NOTE — Telephone Encounter (Signed)
Refill request for Adderall. Last office visit 05/13/2017 with Dr. Lenox PondsEnglish.

## 2017-09-16 NOTE — Telephone Encounter (Signed)
Copied from CRM [phone] [phone]#[id number]. Topic: Quick Communication - See Telephone Encounter >> Sep 16, 2017  2:35 PM Oneal GroutSebastian, Jennifer S wrote: CRM for notification. See Telephone encounter for:  Requesting refill on Adderall 20mg  09/16/17.

## 2017-09-18 MED ORDER — AMPHETAMINE-DEXTROAMPHETAMINE 20 MG PO TABS: 20.0000 mg | ORAL_TABLET | Freq: Every day | ORAL | 0 refills | Status: DC

## 2017-09-18 NOTE — Telephone Encounter (Signed)
Filled the immediate release. The xr should be up to date.

## 2017-09-21 ENCOUNTER — Telehealth: Payer: Self-pay | Admitting: Physician Assistant

## 2017-09-21 DIAGNOSIS — F909 Attention-deficit hyperactivity disorder, unspecified type: Secondary | ICD-10-CM

## 2017-09-21 NOTE — Telephone Encounter (Signed)
Pt states see is out of medication and prescription was written on 12/15 to be filled in 30 days.

## 2017-09-21 NOTE — Telephone Encounter (Signed)
Pt called in to follow up on request. Please assist further.  °

## 2017-09-21 NOTE — Telephone Encounter (Signed)
Copied from CRM [phone]. Topic: Quick Communication - Rx Refill/Question >> Sep 21, 2017 11:43 AM Landry MellowFoltz, Melissa J wrote: Has the patient contacted their pharmacy? No.   (Agent: If no, request that the patient contact the pharmacy for the refill.)   Preferred Pharmacy (with phone number or street name): pt adderrall rx had the wrong date on it. Pt states what she got was rx for next month. She is out of medication. Pt needs to have completed today, cb # is [phone]    Agent: Please be advised that RX refills may take up to 3 business days. We ask that you follow-up with your pharmacy.

## 2017-09-23 ENCOUNTER — Telehealth: Payer: Self-pay | Admitting: Physician Assistant

## 2017-09-23 MED ORDER — AMPHETAMINE-DEXTROAMPHETAMINE 20 MG PO TABS: 20.0000 mg | ORAL_TABLET | Freq: Every day | ORAL | 0 refills | Status: DC

## 2017-09-23 NOTE — Telephone Encounter (Signed)
Patient called and notified Adderall is at her pharmacy per note 09/23/17 Trena PlattStephanie English, PA.

## 2017-09-23 NOTE — Telephone Encounter (Signed)
Copied from CRM [phone]. Topic: Quick Communication - See Telephone Encounter >> Sep 23, 2017 11:49 AM Everardo PacificMoton, Leasha Goldberger, NT wrote: CRM for notification. See Telephone encounter for: Patient calling because she is having trouble getting her Adderall refilled due to the wrong date being put on the refill order. If someone could please give her a call back about this at [phone]  09/23/17.

## 2017-09-23 NOTE — Telephone Encounter (Signed)
Please see message about ADDERALL Rx and advise. Thanks

## 2017-10-13 ENCOUNTER — Telehealth: Payer: Self-pay | Admitting: Physician Assistant

## 2017-10-13 ENCOUNTER — Ambulatory Visit: Payer: Medicaid Other | Admitting: Physician Assistant

## 2017-10-13 NOTE — Telephone Encounter (Signed)
Called pt to remind them of their appt in the morning 10/14/17 °

## 2017-10-14 ENCOUNTER — Encounter: Payer: Self-pay | Admitting: Physician Assistant

## 2017-10-14 ENCOUNTER — Ambulatory Visit: Payer: Medicaid Other | Admitting: Physician Assistant

## 2017-10-14 DIAGNOSIS — F411 Generalized anxiety disorder: Secondary | ICD-10-CM | POA: Diagnosis not present

## 2017-10-14 DIAGNOSIS — F909 Attention-deficit hyperactivity disorder, unspecified type: Secondary | ICD-10-CM | POA: Diagnosis not present

## 2017-10-14 MED ORDER — AMPHETAMINE-DEXTROAMPHET ER 20 MG PO CP24: 20.0000 mg | ORAL_CAPSULE | Freq: Every day | ORAL | 0 refills | Status: DC

## 2017-10-14 MED ORDER — AMPHETAMINE-DEXTROAMPHETAMINE 20 MG PO TABS: 20.0000 mg | ORAL_TABLET | Freq: Every day | ORAL | 0 refills | Status: DC

## 2017-10-14 NOTE — Progress Notes (Signed)
[facility] [address], [place] [phone]  Date:  10/14/2017   Name:  Crystal Jamesllison Darius   DOB:  [birth year]   MRN:  [id number]  PCP:  Garnetta BuddyEnglish, Shane Badeaux D, PA    History of Present Illness:  Crystal Webb is a 42 y.o. female patient who presents to PCP with  Chief Complaint  Patient presents with  . Medication Refill    adderall     Patient would like refill on medication at this time.  She notes no new side effects to the medication. She notes no palpitations or change in appetite. She is taking the adderall 20 immediate as needed on moreso weekend when a full day of concentration is not necessary for her employment.  There are no active problems to display for this patient.   History reviewed. No pertinent past medical history.  History reviewed. No pertinent surgical history.  Social History   Tobacco Use  . Smoking status: Current Every Day Smoker  . Smokeless tobacco: Never Used  Substance Use Topics  . Alcohol use: No  . Drug use: No    Family History  Problem Relation Age of Onset  . Stroke Mother     No Known Allergies  Medication list has been reviewed and updated.  Current Outpatient Medications on File Prior to Visit  Medication Sig Dispense Refill  . amphetamine-dextroamphetamine (ADDERALL) 20 MG tablet Take 1 tablet (20 mg total) by mouth daily. 30 tablet 0  . venlafaxine XR (EFFEXOR-XR) 150 MG 24 hr capsule Take 1 capsule (150 mg total) by mouth daily with breakfast. 90 capsule 1  . amphetamine-dextroamphetamine (ADDERALL XR) 20 MG 24 hr capsule Take 1 capsule (20 mg total) by mouth daily. (Patient not taking: Reported on 10/14/2017) 30 capsule 0  . amphetamine-dextroamphetamine (ADDERALL XR) 20 MG 24 hr capsule Take 1 capsule (20 mg total) by mouth daily. Please fill after 30 days of last refill. (Patient not taking: Reported on 10/14/2017) 30 capsule 0  . amphetamine-dextroamphetamine (ADDERALL XR) 20 MG 24 hr capsule  Take 1 capsule (20 mg total) by mouth daily. Please fill after 60 days. (Patient not taking: Reported on 10/14/2017) 30 capsule 0   No current facility-administered medications on file prior to visit.     ROS ROS otherwise unremarkable unless listed above.  Physical Examination: BP 132/82   Pulse 79   Temp 98.4 F (36.9 C) (Oral)   Resp 16   Ht 5\' 1"  (1.549 m)   Wt 142 lb (64.4 kg)   LMP  (LMP Unknown)   SpO2 99%   BMI 26.83 kg/m  Ideal Body Weight: Weight in (lb) to have BMI = 25: 132  Physical Exam  Constitutional: She is oriented to person, place, and time. She appears well-developed and well-nourished. No distress.  HENT:  Head: Normocephalic and atraumatic.  Right Ear: External ear normal.  Left Ear: External ear normal.  Eyes: Conjunctivae and EOM are normal. Pupils are equal, round, and reactive to light.  Cardiovascular: Normal rate and regular rhythm. Exam reveals no friction rub.  No murmur heard. Pulmonary/Chest: Effort normal. No respiratory distress.  Neurological: She is alert and oriented to person, place, and time.  Skin: She is not diaphoretic.  Psychiatric: She has a normal mood and affect. Her behavior is normal.     Assessment and Plan: Crystal Jamesllison Tipler is a 42 y.o. female who is here today for cc of  Chief Complaint  Patient presents with  . Medication  Refill    adderall  3 month refill given at visit Rtc in 3 months. Attention deficit hyperactivity disorder (ADHD), unspecified ADHD type - Plan: amphetamine-dextroamphetamine (ADDERALL) 20 MG tablet, amphetamine-dextroamphetamine (ADDERALL XR) 20 MG 24 hr capsule, amphetamine-dextroamphetamine (ADDERALL XR) 20 MG 24 hr capsule, amphetamine-dextroamphetamine (ADDERALL XR) 20 MG 24 hr capsule, DISCONTINUED: amphetamine-dextroamphetamine (ADDERALL XR) 20 MG 24 hr capsule, DISCONTINUED: amphetamine-dextroamphetamine (ADDERALL XR) 20 MG 24 hr capsule, DISCONTINUED: amphetamine-dextroamphetamine (ADDERALL) 20  MG tablet, DISCONTINUED: amphetamine-dextroamphetamine (ADDERALL XR) 20 MG 24 hr capsule  Anxiety state  Trena Platt, PA-C [facility] 2/17/20196:18 PM

## 2017-10-14 NOTE — Patient Instructions (Addendum)
     IF you received an x-ray today, you will receive an invoice from [facility]. Please contact [facility] at [phone] with questions or concerns regarding your invoice.   IF you received labwork today, you will receive an invoice from [facility]. Please contact [facility] at [phone] with questions or concerns regarding your invoice.   Our billing staff will not be able to assist you with questions regarding bills from these companies.  You will be contacted with the lab results as soon as they are available. The fastest way to get your results is to activate your My Chart account. Instructions are located on the last page of this paperwork. If you have not heard from us regarding the results in 2 weeks, please contact this office.     

## 2017-10-18 ENCOUNTER — Telehealth: Payer: Self-pay | Admitting: Physician Assistant

## 2017-10-18 NOTE — Telephone Encounter (Signed)
Copied from CRM [id number]. Topic: Quick Communication - See Telephone Encounter >> Oct 18, 2017 11:35 AM Jolayne Hainesaylor, Brittany L wrote: CRM for notification. See Telephone encounter for:   10/18/17.  Patient said Trena PlattStephanie English wrote her some scripts for amphetamine-dextroamphetamine (ADDERALL XR) 20 MG 24 hr capsule. & amphetamine-dextroamphetamine (ADDERALL) 20 MG tablet She said she put the dates wrong on there. It says do not refill again until the 20th & 25th. She needs it to be 16th.   [facility] [address]- [place], [place] - [id number]

## 2017-10-18 NOTE — Telephone Encounter (Signed)
See request. Thanks. 

## 2017-10-20 ENCOUNTER — Encounter: Payer: Self-pay | Admitting: Physician Assistant

## 2017-10-21 NOTE — Telephone Encounter (Signed)
Please advise 

## 2017-12-23 ENCOUNTER — Other Ambulatory Visit: Payer: Self-pay | Admitting: Physician Assistant

## 2017-12-23 DIAGNOSIS — F909 Attention-deficit hyperactivity disorder, unspecified type: Secondary | ICD-10-CM

## 2017-12-23 NOTE — Telephone Encounter (Signed)
Copied from CRM [phone]. Topic: Quick Communication - Rx Refill/Question >> Dec 23, 2017  9:15 AM Oneal GroutSebastian, Jennifer S wrote: Medication: amphetamine-dextroamphetamine (ADDERALL XR) 20 MG 24 hr capsule  Has the patient contacted their pharmacy? Yes.   (Agent: If no, request that the patient contact the pharmacy for the refill.) Preferred Pharmacy (with phone number or street name): [facility] [address] Agent: Please be advised that RX refills may take up to 3 business days. We ask that you follow-up with your pharmacy.

## 2017-12-23 NOTE — Telephone Encounter (Signed)
Copied from CRM [id number]. Topic: Quick Communication - Rx Refill/Question >> Dec 23, 2017  5:12 PM Floria RavelingStovall, Shana A wrote: Medication: amphetamine-dextroamphetamine (ADDERALL XR) 20 MG 24 hr capsule [[id number]][[id number]]  Has the patient contacted their pharmacy? no (Agent: If no, request that the patient contact the pharmacy for the refill.) Preferred Pharmacy (with phone number or street name): [facility] [address] Agent: Please be advised that RX refills may take up to 3 business days. We ask that you follow-up with your pharmacy.

## 2017-12-23 NOTE — Telephone Encounter (Signed)
Adderall refill Last OV: 10/14/17 Last Refill: ? PCP: Trena PlattStephanie English Pharmacy:  [address] [address] [place], [place]

## 2017-12-24 NOTE — Telephone Encounter (Signed)
Patient calling to check the status of her Adderall refill. Did advise patient to keep in contact with her pharmacy as well for her medication

## 2017-12-24 NOTE — Telephone Encounter (Signed)
Please advise refill of Adderall.

## 2017-12-25 ENCOUNTER — Telehealth: Payer: Self-pay

## 2017-12-25 NOTE — Telephone Encounter (Signed)
Pt requesting March refill for immediate release Adderall.  Provider notified and stated she has received a message with request.

## 2017-12-27 MED ORDER — AMPHETAMINE-DEXTROAMPHETAMINE 20 MG PO TABS: 20.0000 mg | ORAL_TABLET | Freq: Every day | ORAL | 0 refills | Status: DC

## 2017-12-27 NOTE — Telephone Encounter (Signed)
I filled this today.  This was last picked up 2.25.2019.  You must wait 30 days until after you picked up your last refill.  She has one more refill of the extended release.  Please follow up with a new provider in my absence.

## 2017-12-27 NOTE — Telephone Encounter (Signed)
Called office twice and did not get an answer. Patient said she really needs this refilled today. Requested on 3/21.  Please call patient [phone]

## 2018-01-12 ENCOUNTER — Encounter: Payer: Self-pay | Admitting: Physician Assistant

## 2018-01-26 ENCOUNTER — Ambulatory Visit: Payer: Medicaid Other | Admitting: Physician Assistant

## 2018-01-27 ENCOUNTER — Ambulatory Visit: Payer: Medicaid Other | Admitting: Family Medicine

## 2018-01-27 NOTE — Progress Notes (Deleted)
  No chief complaint on file.   HPI  4 review of systems  No past medical history on file.  Current Outpatient Medications  Medication Sig Dispense Refill  . amphetamine-dextroamphetamine (ADDERALL XR) 20 MG 24 hr capsule Take 1 capsule (20 mg total) by mouth daily. Please fill after 10/29/2017 30 capsule 0  . amphetamine-dextroamphetamine (ADDERALL XR) 20 MG 24 hr capsule Take 1 capsule (20 mg total) by mouth daily. Please refill after 60 days after 10/29/2017 30 capsule 0  . amphetamine-dextroamphetamine (ADDERALL XR) 20 MG 24 hr capsule Take 1 capsule (20 mg total) by mouth daily. Please fill after 30 days after 10/29/2017 30 capsule 0  . amphetamine-dextroamphetamine (ADDERALL) 20 MG tablet Take 1 tablet (20 mg total) by mouth daily. 30 tablet 0  . venlafaxine XR (EFFEXOR-XR) 150 MG 24 hr capsule Take 1 capsule (150 mg total) by mouth daily with breakfast. 90 capsule 1   No current facility-administered medications for this visit.     Allergies: No Known Allergies  No past surgical history on file.  Social History   Socioeconomic History  . Marital status: Single    Spouse name: Not on file  . Number of children: Not on file  . Years of education: Not on file  . Highest education level: Not on file  Occupational History  . Not on file  Social Needs  . Financial resource strain: Not on file  . Food insecurity:    Worry: Not on file    Inability: Not on file  . Transportation needs:    Medical: Not on file    Non-medical: Not on file  Tobacco Use  . Smoking status: Current Every Day Smoker  . Smokeless tobacco: Never Used  Substance and Sexual Activity  . Alcohol use: No  . Drug use: No  . Sexual activity: Not on file  Lifestyle  . Physical activity:    Days per week: Not on file    Minutes per session: Not on file  . Stress: Not on file  Relationships  . Social connections:    Talks on phone: Not on file    Gets together: Not on file    Attends religious  service: Not on file    Active member of club or organization: Not on file    Attends meetings of clubs or organizations: Not on file    Relationship status: Not on file  Other Topics Concern  . Not on file  Social History Narrative  . Not on file    Family History  Problem Relation Age of Onset  . Stroke Mother      ROS Review of Systems See HPI Constitution: No fevers or chills No malaise No diaphoresis Skin: No rash or itching Eyes: no blurry vision, no double vision GU: no dysuria or hematuria Neuro: no dizziness or headaches * all others reviewed and negative   Objective: There were no vitals filed for this visit.  Physical Exam  Assessment and Plan There are no diagnoses linked to this encounter.   Crystal Webb [employer]

## 2018-01-28 ENCOUNTER — Ambulatory Visit: Payer: Medicaid Other | Admitting: Physician Assistant

## 2018-02-01 ENCOUNTER — Ambulatory Visit: Payer: Medicaid Other | Admitting: Physician Assistant

## 2018-02-02 ENCOUNTER — Other Ambulatory Visit: Payer: Self-pay

## 2018-02-02 ENCOUNTER — Ambulatory Visit (INDEPENDENT_AMBULATORY_CARE_PROVIDER_SITE_OTHER): Payer: Self-pay | Admitting: Physician Assistant

## 2018-02-02 ENCOUNTER — Telehealth: Payer: Self-pay

## 2018-02-02 ENCOUNTER — Telehealth: Payer: Self-pay | Admitting: Physician Assistant

## 2018-02-02 ENCOUNTER — Encounter: Payer: Self-pay | Admitting: Physician Assistant

## 2018-02-02 DIAGNOSIS — F909 Attention-deficit hyperactivity disorder, unspecified type: Secondary | ICD-10-CM

## 2018-02-02 DIAGNOSIS — F411 Generalized anxiety disorder: Secondary | ICD-10-CM

## 2018-02-02 MED ORDER — AMPHETAMINE-DEXTROAMPHETAMINE 20 MG PO TABS: 20.0000 mg | ORAL_TABLET | Freq: Every day | ORAL | 0 refills | Status: DC

## 2018-02-02 MED ORDER — AMPHETAMINE-DEXTROAMPHET ER 20 MG PO CP24: 20.0000 mg | ORAL_CAPSULE | Freq: Every day | ORAL | 0 refills | Status: DC

## 2018-02-02 MED ORDER — VENLAFAXINE HCL ER 150 MG PO CP24: 150.0000 mg | ORAL_CAPSULE | Freq: Every day | ORAL | 1 refills | Status: DC

## 2018-02-02 NOTE — Telephone Encounter (Signed)
Patient said to ignore her last message about sending the prescriptions to a different pharmacy.  She said her preferred pharmacy called and said they did have the prescriptions in stock.

## 2018-02-02 NOTE — Telephone Encounter (Signed)
Please advise 

## 2018-02-02 NOTE — Telephone Encounter (Signed)
Copied from CRM [id number]. Topic: Quick Communication - See Telephone Encounter >> Feb 02, 2018  3:10 PM Clack, Princella Pellegrini wrote: CRM for notification. See Telephone encounter for: 02/02/18.  Pt would like all her medications that was called in for today's visit sent to:  [facility]  [address], [place], [place] [address] [phone]

## 2018-02-02 NOTE — Patient Instructions (Addendum)
Come back in 3 months.  Please see below to be sure you are keeping up with your routine health maintenance.  Thank you for coming in today. I hope you feel we met your needs.  Feel free to call PCP if you have any questions or further requests.  Please consider signing up for MyChart if you do not already have it, as this is a great way to communicate with me.  Best,  Whitney McVey, PA-C   Health Maintenance, Female Adopting a healthy lifestyle and getting preventive care can go a long way to promote health and wellness. Talk with your health care provider about what schedule of regular examinations is right for you. This is a good chance for you to check in with your provider about disease prevention and staying healthy. In between checkups, there are plenty of things you can do on your own. Experts have done a lot of research about which lifestyle changes and preventive measures are most likely to keep you healthy. Ask your health care provider for more information. Weight and diet Eat a healthy diet  Be sure to include plenty of vegetables, fruits, low-fat dairy products, and lean protein.  Do not eat a lot of foods high in solid fats, added sugars, or salt.  Get regular exercise. This is one of the most important things you can do for your health. ? Most adults should exercise for at least 150 minutes each week. The exercise should increase your heart rate and make you sweat (moderate-intensity exercise). ? Most adults should also do strengthening exercises at least twice a week. This is in addition to the moderate-intensity exercise.  Maintain a healthy weight  Body mass index (BMI) is a measurement that can be used to identify possible weight problems. It estimates body fat based on height and weight. Your health care provider can help determine your BMI and help you achieve or maintain a healthy weight.  For females 49 years of age and older: ? A BMI below 18.5 is considered  underweight. ? A BMI of 18.5 to 24.9 is normal. ? A BMI of 25 to 29.9 is considered overweight. ? A BMI of 30 and above is considered obese.  Watch levels of cholesterol and blood lipids  You should start having your blood tested for lipids and cholesterol at 44 years of age, then have this test every 5 years.  You may need to have your cholesterol levels checked more often if: ? Your lipid or cholesterol levels are high. ? You are older than 44 years of age. ? You are at high risk for heart disease.  Cancer screening Lung Cancer  Lung cancer screening is recommended for adults 39-34 years old who are at high risk for lung cancer because of a history of smoking.  A yearly low-dose CT scan of the lungs is recommended for people who: ? Currently smoke. ? Have quit within the past 15 years. ? Have at least a 30-pack-year history of smoking. A pack year is smoking an average of one pack of cigarettes a day for 1 year.  Yearly screening should continue until it has been 15 years since you quit.  Yearly screening should stop if you develop a health problem that would prevent you from having lung cancer treatment.  Breast Cancer  Practice breast self-awareness. This means understanding how your breasts normally appear and feel.  It also means doing regular breast self-exams. Let your health care provider know about any changes, no  matter how small.  If you are in your 20s or 30s, you should have a clinical breast exam (CBE) by a health care provider every 1-3 years as part of a regular health exam.  If you are 75 or older, have a CBE every year. Also consider having a breast X-ray (mammogram) every year.  If you have a family history of breast cancer, talk to your health care provider about genetic screening.  If you are at high risk for breast cancer, talk to your health care provider about having an MRI and a mammogram every year.  Breast cancer gene (BRCA) assessment is  recommended for women who have family members with BRCA-related cancers. BRCA-related cancers include: ? Breast. ? Ovarian. ? Tubal. ? Peritoneal cancers.  Results of the assessment will determine the need for genetic counseling and BRCA1 and BRCA2 testing.  Cervical Cancer Your health care provider may recommend that you be screened regularly for cancer of the pelvic organs (ovaries, uterus, and vagina). This screening involves a pelvic examination, including checking for microscopic changes to the surface of your cervix (Pap test). You may be encouraged to have this screening done every 3 years, beginning at age 55.  For women ages 60-65, health care providers may recommend pelvic exams and Pap testing every 3 years, or they may recommend the Pap and pelvic exam, combined with testing for human papilloma virus (HPV), every 5 years. Some types of HPV increase your risk of cervical cancer. Testing for HPV may also be done on women of any age with unclear Pap test results.  Other health care providers may not recommend any screening for nonpregnant women who are considered low risk for pelvic cancer and who do not have symptoms. Ask your health care provider if a screening pelvic exam is right for you.  If you have had past treatment for cervical cancer or a condition that could lead to cancer, you need Pap tests and screening for cancer for at least 20 years after your treatment. If Pap tests have been discontinued, your risk factors (such as having a new sexual partner) need to be reassessed to determine if screening should resume. Some women have medical problems that increase the chance of getting cervical cancer. In these cases, your health care provider may recommend more frequent screening and Pap tests.  Colorectal Cancer  This type of cancer can be detected and often prevented.  Routine colorectal cancer screening usually begins at 44 years of age and continues through 44 years of  age.  Your health care provider may recommend screening at an earlier age if you have risk factors for colon cancer.  Your health care provider may also recommend using home test kits to check for hidden blood in the stool.  A small camera at the end of a tube can be used to examine your colon directly (sigmoidoscopy or colonoscopy). This is done to check for the earliest forms of colorectal cancer.  Routine screening usually begins at age 50.  Direct examination of the colon should be repeated every 5-10 years through 44 years of age. However, you may need to be screened more often if early forms of precancerous polyps or small growths are found.  Skin Cancer  Check your skin from head to toe regularly.  Tell your health care provider about any new moles or changes in moles, especially if there is a change in a mole's shape or color.  Also tell your health care provider if you have  a mole that is larger than the size of a pencil eraser.  Always use sunscreen. Apply sunscreen liberally and repeatedly throughout the day.  Protect yourself by wearing long sleeves, pants, a wide-brimmed hat, and sunglasses whenever you are outside.  Heart disease, diabetes, and high blood pressure  High blood pressure causes heart disease and increases the risk of stroke. High blood pressure is more likely to develop in: ? People who have blood pressure in the high end of the normal range (130-139/85-89 mm Hg). ? People who are overweight or obese. ? People who are African American.  If you are 75-2 years of age, have your blood pressure checked every 3-5 years. If you are 10 years of age or older, have your blood pressure checked every year. You should have your blood pressure measured twice-once when you are at a hospital or clinic, and once when you are not at a hospital or clinic. Record the average of the two measurements. To check your blood pressure when you are not at a hospital or clinic, you  can use: ? An automated blood pressure machine at a pharmacy. ? A home blood pressure monitor.  If you are between 24 years and 57 years old, ask your health care provider if you should take aspirin to prevent strokes.  Have regular diabetes screenings. This involves taking a blood sample to check your fasting blood sugar level. ? If you are at a normal weight and have a low risk for diabetes, have this test once every three years after 44 years of age. ? If you are overweight and have a high risk for diabetes, consider being tested at a younger age or more often. Preventing infection Hepatitis B  If you have a higher risk for hepatitis B, you should be screened for this virus. You are considered at high risk for hepatitis B if: ? You were born in a country where hepatitis B is common. Ask your health care provider which countries are considered high risk. ? Your parents were born in a high-risk country, and you have not been immunized against hepatitis B (hepatitis B vaccine). ? You have HIV or AIDS. ? You use needles to inject street drugs. ? You live with someone who has hepatitis B. ? You have had sex with someone who has hepatitis B. ? You get hemodialysis treatment. ? You take certain medicines for conditions, including cancer, organ transplantation, and autoimmune conditions.  Hepatitis C  Blood testing is recommended for: ? Everyone born from [birth year] through 1967. ? Anyone with known risk factors for hepatitis C.  Sexually transmitted infections (STIs)  You should be screened for sexually transmitted infections (STIs) including gonorrhea and chlamydia if: ? You are sexually active and are younger than 44 years of age. ? You are older than 44 years of age and your health care provider tells you that you are at risk for this type of infection. ? Your sexual activity has changed since you were last screened and you are at an increased risk for chlamydia or gonorrhea. Ask your  health care provider if you are at risk.  If you do not have HIV, but are at risk, it may be recommended that you take a prescription medicine daily to prevent HIV infection. This is called pre-exposure prophylaxis (PrEP). You are considered at risk if: ? You are sexually active and do not regularly use condoms or know the HIV status of your partner(s). ? You take drugs by injection. ?  You are sexually active with a partner who has HIV.  Talk with your health care provider about whether you are at high risk of being infected with HIV. If you choose to begin PrEP, you should first be tested for HIV. You should then be tested every 3 months for as long as you are taking PrEP. Pregnancy  If you are premenopausal and you may become pregnant, ask your health care provider about preconception counseling.  If you may become pregnant, take 400 to 800 micrograms (mcg) of folic acid every day.  If you want to prevent pregnancy, talk to your health care provider about birth control (contraception). Osteoporosis and menopause  Osteoporosis is a disease in which the bones lose minerals and strength with aging. This can result in serious bone fractures. Your risk for osteoporosis can be identified using a bone density scan.  If you are 61 years of age or older, or if you are at risk for osteoporosis and fractures, ask your health care provider if you should be screened.  Ask your health care provider whether you should take a calcium or vitamin D supplement to lower your risk for osteoporosis.  Menopause may have certain physical symptoms and risks.  Hormone replacement therapy may reduce some of these symptoms and risks. Talk to your health care provider about whether hormone replacement therapy is right for you. Follow these instructions at home:  Schedule regular health, dental, and eye exams.  Stay current with your immunizations.  Do not use any tobacco products including cigarettes, chewing  tobacco, or electronic cigarettes.  If you are pregnant, do not drink alcohol.  If you are breastfeeding, limit how much and how often you drink alcohol.  Limit alcohol intake to no more than 1 drink per day for nonpregnant women. One drink equals 12 ounces of beer, 5 ounces of wine, or 1 ounces of hard liquor.  Do not use street drugs.  Do not share needles.  Ask your health care provider for help if you need support or information about quitting drugs.  Tell your health care provider if you often feel depressed.  Tell your health care provider if you have ever been abused or do not feel safe at home. This information is not intended to replace advice given to you by your health care provider. Make sure you discuss any questions you have with your health care provider. Document Released: 04/06/2011 Document Revised: 02/27/2016 Document Reviewed: 06/25/2015 Elsevier Interactive Patient Education  2018 [employer].   IF you received an x-ray today, you will receive an invoice from [facility]. Please contact [facility] at [phone] with questions or concerns regarding your invoice.   IF you received labwork today, you will receive an invoice from [facility]. Please contact [facility] at [phone] with questions or concerns regarding your invoice.   Our billing staff will not be able to assist you with questions regarding bills from these companies.  You will be contacted with the lab results as soon as they are available. The fastest way to get your results is to activate your My Chart account. Instructions are located on the last page of this paperwork. If you have not heard from [place] regarding the results in 2 weeks, please contact this office.

## 2018-02-02 NOTE — Telephone Encounter (Signed)
Called pt and LVM enquiring regarding which pharmacy pt would like medication switched to. Per PA McVey pharmacy has already filled prescriptions and pt has picked up medications.

## 2018-02-02 NOTE — Telephone Encounter (Signed)
Copied from CRM [id number]. Topic: Quick Communication - Rx Refill/Question >> Feb 02, 2018  4:23 PM Cipriano Bunker wrote: Medication:   amphetamine-dextroamphetamine (ADDERALL) 20 MG tablet amphetamine-dextroamphetamine (ADDERALL XR) 20 MG 24 hr capsule venlafaxine XR (EFFEXOR-XR) 150 MG 24 hr capsule  [facility] [address] [facility] and asking if could be called into  [facility] [address]   Has the patient contacted their pharmacy? Yes.   (Agent: If no, request that the patient contact the pharmacy for the refill.) Preferred Pharmacy (with phone number or street name): [facility] [address]  Agent: Please be advised that RX refills may take up to 3 business days. We ask that you follow-up with your pharmacy.   She is out of medication

## 2018-02-02 NOTE — Telephone Encounter (Signed)
Pharmacy calling to notify PA McVey that a tech did advise the patient earlier that adderrall was out of stock but it actually wasn't. It was because the patient was told it was out of stock, that the patient requested to have the script sent to a different pharmacy. Please advise.

## 2018-02-02 NOTE — Telephone Encounter (Signed)
Pt. Returned phone call.   Please call 5/2 am [phone]

## 2018-02-02 NOTE — Telephone Encounter (Signed)
02/02/2018 - WHEN PATIENT GOT TO CHECK-OUT I COULD NOT TELL HER WHAT THE CHARGE WAS BECAUSE Crystal Webb HAD NOT PUT IT IN THE COMPUTER YET. I ASKED HER TO WAIT A MINUTE AND I WOULD CATCH HER WHEN SHE CAME OUT OF HER EXAM ROOM. WHEN I WENT TO CHECK HER OUT, I COULD NOT FIND HER. I LEFT A MESSAGE ON HER MACHINE TELLING HER TO CALL BACK SO I COULD TELL HER THE CHARGE FOR TODAY'S OFFICE VISIT. MBC

## 2018-02-02 NOTE — Telephone Encounter (Signed)
See below
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
## 2018-02-03 NOTE — Telephone Encounter (Signed)
Noted. Telephone encounter closed.  

## 2018-02-03 NOTE — Telephone Encounter (Signed)
Called pt. Pt stated that she does not need these medications. This may have been an old request. These have already been picked up from pharmacy. Verified with pt and pharmacy that she has medications.

## 2018-02-03 NOTE — Telephone Encounter (Signed)
Resolved in separate encounter

## 2018-02-03 NOTE — Telephone Encounter (Signed)
Pt states [facility] [address] is out of the meds that she is requesting: adderall 20 MG tab, adderall XR 20 MG 24 Hr capsule and effexor-XR  150 MG 24 hr capsule. So asking these resent to [facility] on [address].   These med were sent out on 02/02/18 Provider  Hulen Shouts, PA Please review

## 2018-03-01 ENCOUNTER — Encounter: Payer: Self-pay | Admitting: Physician Assistant

## 2018-03-01 ENCOUNTER — Ambulatory Visit: Payer: Medicaid Other | Admitting: Physician Assistant

## 2018-03-01 ENCOUNTER — Other Ambulatory Visit: Payer: Self-pay

## 2018-03-01 ENCOUNTER — Ambulatory Visit (INDEPENDENT_AMBULATORY_CARE_PROVIDER_SITE_OTHER): Payer: Self-pay | Admitting: Physician Assistant

## 2018-03-01 VITALS — BP 122/66 | HR 76 | Temp 98.4°F | Resp 18 | Ht 62.0 in | Wt 135.2 lb

## 2018-03-01 DIAGNOSIS — F909 Attention-deficit hyperactivity disorder, unspecified type: Secondary | ICD-10-CM

## 2018-03-01 NOTE — Progress Notes (Signed)
   Crystal Webb  MRN: [id number] DOB: [birth year]  PCP: Crystal Webb, No Pcp Per  Subjective:  Pt is a 42 year old female who presents to clinic for medication refill of Adderall.   She thought she was supposed to have an OV today for refill. She was here 5/01 for the same.  Adderall  XR on the weekdays.  Adderall  immediate release after lunch and on some weekends. She does have off days from Adderall.   Review of Systems  Gastrointestinal: Negative for abdominal pain, diarrhea, nausea and vomiting.  Psychiatric/Behavioral: Positive for decreased concentration. The Crystal Webb is not nervous/anxious.     There are no active problems to display for this Crystal Webb.   Current Outpatient Medications on File Prior to Visit  Medication Sig Dispense Refill  . [START ON 04/04/2018] amphetamine-dextroamphetamine (ADDERALL XR) 20 MG 24 hr capsule Take 1 capsule (20 mg total) by mouth daily. Please fill after 10/29/2017 30 capsule 0  . [START ON 04/04/2018] amphetamine-dextroamphetamine (ADDERALL) 20 MG tablet Take 1 tablet (20 mg total) by mouth daily. 30 tablet 0  . venlafaxine XR (EFFEXOR-XR) 150 MG 24 hr capsule Take 1 capsule (150 mg total) by mouth daily with breakfast. 90 capsule 1   No current facility-administered medications on file prior to visit.     No Known Allergies   Objective:  BP 122/66 (BP Location: Left Arm, Crystal Webb Position: Sitting, Cuff Size: Normal)   Pulse 76   Temp 98.4 F (36.9 C) (Oral)   Resp 18   Ht  (1.575 m)   Wt 135 lb 3.2 oz (61.3 kg)   SpO2 99%   BMI 24.73 kg/m   Physical Exam  Constitutional: She is oriented to person, place, and time. No distress.  Cardiovascular: Normal rate, regular rhythm and normal heart sounds.  Neurological: She is alert and oriented to person, place, and time.  Skin: Skin is warm and dry.  Psychiatric: Judgment normal.  Vitals reviewed.   Assessment and Plan :  1. Attention deficit hyperactivity disorder (ADHD),  unspecified ADHD type - Called pharmacy and confirmed medication compliance. Pt is here bc she was under the impression she needed an office visit however she was here 3 weeks ago for the same.  She has 2 more months of her medication which is at her pharmacy, this was confirmed.  Will not charge for today's visit.  Marco Collie, PA-C  Primary Care at [facility] 03/01/2018 12:01 PM

## 2018-03-01 NOTE — Patient Instructions (Signed)
     IF you received an x-ray today, you will receive an invoice from [facility]. Please contact [facility] at [phone] with questions or concerns regarding your invoice.   IF you received labwork today, you will receive an invoice from [facility]. Please contact [facility] at [phone] with questions or concerns regarding your invoice.   Our billing staff will not be able to assist you with questions regarding bills from these companies.  You will be contacted with the lab results as soon as they are available. The fastest way to get your results is to activate your My Chart account. Instructions are located on the last page of this paperwork. If you have not heard from us regarding the results in 2 weeks, please contact this office.     

## 2018-05-05 ENCOUNTER — Ambulatory Visit (INDEPENDENT_AMBULATORY_CARE_PROVIDER_SITE_OTHER): Payer: Medicaid Other | Admitting: Physician Assistant

## 2018-05-05 ENCOUNTER — Other Ambulatory Visit: Payer: Self-pay

## 2018-05-05 ENCOUNTER — Encounter: Payer: Self-pay | Admitting: Physician Assistant

## 2018-05-05 DIAGNOSIS — F909 Attention-deficit hyperactivity disorder, unspecified type: Secondary | ICD-10-CM

## 2018-05-05 MED ORDER — AMPHETAMINE-DEXTROAMPHET ER 20 MG PO CP24: 20.0000 mg | ORAL_CAPSULE | Freq: Every day | ORAL | 0 refills | Status: DC

## 2018-05-05 MED ORDER — AMPHETAMINE-DEXTROAMPHETAMINE 20 MG PO TABS: 20.0000 mg | ORAL_TABLET | Freq: Every day | ORAL | 0 refills | Status: DC

## 2018-05-05 NOTE — Progress Notes (Signed)
  No chief complaint on file.   HPI  4 review of systems  No past medical history on file.  Current Outpatient Medications  Medication Sig Dispense Refill  . amphetamine-dextroamphetamine (ADDERALL XR) 20 MG 24 hr capsule Take 1 capsule (20 mg total) by mouth daily. Please fill after 10/29/2017 30 capsule 0  . amphetamine-dextroamphetamine (ADDERALL) 20 MG tablet Take 1 tablet (20 mg total) by mouth daily. 30 tablet 0  . venlafaxine XR (EFFEXOR-XR) 150 MG 24 hr capsule Take 1 capsule (150 mg total) by mouth daily with breakfast. 90 capsule 1   No current facility-administered medications for this visit.     Allergies: No Known Allergies  No past surgical history on file.  Social History   Socioeconomic History  . Marital status: Single    Spouse name: Not on file  . Number of children: Not on file  . Years of education: Not on file  . Highest education level: Not on file  Occupational History  . Not on file  Social Needs  . Financial resource strain: Not on file  . Food insecurity:    Worry: Not on file    Inability: Not on file  . Transportation needs:    Medical: Not on file    Non-medical: Not on file  Tobacco Use  . Smoking status: Current Every Day Smoker  . Smokeless tobacco: Never Used  Substance and Sexual Activity  . Alcohol use: No  . Drug use: No  . Sexual activity: Not on file  Lifestyle  . Physical activity:    Days per week: Not on file    Minutes per session: Not on file  . Stress: Not on file  Relationships  . Social connections:    Talks on phone: Not on file    Gets together: Not on file    Attends religious service: Not on file    Active member of club or organization: Not on file    Attends meetings of clubs or organizations: Not on file    Relationship status: Not on file  Other Topics Concern  . Not on file  Social History Narrative  . Not on file    Family History  Problem Relation Age of Onset  . Stroke Mother       ROS Review of Systems See HPI Constitution: No fevers or chills No malaise No diaphoresis Skin: No rash or itching Eyes: no blurry vision, no double vision GU: no dysuria or hematuria Neuro: no dizziness or headaches * all others reviewed and negative   Objective: There were no vitals filed for this visit.  Physical Exam  Assessment and Plan There are no diagnoses linked to this encounter.   Crystal Webb P [employer]

## 2018-05-05 NOTE — Patient Instructions (Addendum)
I will refill your medications electronically in 3 months. Come back and see me in the office 6 months.  We recommend that you schedule a mammogram for breast cancer screening. Typically, you do not need a referral to do this. Please contact a local imaging center to schedule your mammogram.  [facility]) - [phone] or [phone]  [facility] - [phone]) [phone] [facility] - [phone](336) [phone]   IF you received an x-ray today, you will receive an invoice from [facility]. Please contact [facility] at [phone] with questions or concerns regarding your invoice.   IF you received labwork today, you will receive an invoice from [facility]. Please contact [facility] at [phone] with questions or concerns regarding your invoice.   Our billing staff will not be able to assist you with questions regarding bills from these companies.  You will be contacted with the lab results as soon as they are available. The fastest way to get your results is to activate your My Chart account. Instructions are located on the last page of this paperwork. If you have not heard from us regarding the results in 2 weeks, please contact this office.
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
## 2018-06-23 ENCOUNTER — Other Ambulatory Visit: Payer: Self-pay | Admitting: Physician Assistant

## 2018-06-23 DIAGNOSIS — F411 Generalized anxiety disorder: Secondary | ICD-10-CM

## 2018-08-05 ENCOUNTER — Other Ambulatory Visit: Payer: Self-pay | Admitting: General Practice

## 2018-08-05 DIAGNOSIS — F909 Attention-deficit hyperactivity disorder, unspecified type: Secondary | ICD-10-CM

## 2018-08-05 NOTE — Telephone Encounter (Signed)
Copied from CRM [id number]. Topic: Quick Communication - Rx Refill/Question >> Aug 05, 2018 12:51 PM Tamela Oddi wrote: Medication: amphetamine-dextroamphetamine (ADDERALL XR) 20 MG 24 hr capsule    Preferred Pharmacy (with phone number or street name): [id number] [place], [place] - [address] AT [facility] [address] & [facility] [address] [phone] (Phone) [phone] (Fax)

## 2018-08-05 NOTE — Telephone Encounter (Signed)
Requested medication (s) are due for refill today: yes  Requested medication (s) are on the active medication list: yes  Last refill:  Current prescriptions expired  Future visit scheduled: No      Requested Prescriptions  Pending Prescriptions Disp Refills   amphetamine-dextroamphetamine (ADDERALL XR) 20 MG 24 hr capsule 30 capsule 0    Sig: Take 1 capsule (20 mg total) by mouth daily. Please fill after 10/29/2017     Not Delegated - Psychiatry:  Stimulants/ADHD Failed - 08/05/2018  1:05 PM      Failed - This refill cannot be delegated      Failed - Urine Drug Screen completed in last 360 days.      Failed - Valid encounter within last 3 months    Recent Outpatient Visits          3 months ago Attention deficit hyperactivity disorder (ADHD), unspecified ADHD type   Primary Care at [facility], Madelaine Bhat, PA-C   5 months ago Attention deficit hyperactivity disorder (ADHD), unspecified ADHD type   Primary Care at [facility], Madelaine Bhat, PA-C   6 months ago Attention deficit hyperactivity disorder (ADHD), unspecified ADHD type   Primary Care at [facility], Madelaine Bhat, PA-C   9 months ago Attention deficit hyperactivity disorder (ADHD), unspecified ADHD type   Primary Care at [place], [place], [place]   1 year ago Attention deficit hyperactivity disorder (ADHD), unspecified ADHD type   Primary Care at [place], [place], [place]

## 2018-08-10 ENCOUNTER — Other Ambulatory Visit: Payer: Self-pay | Admitting: Physician Assistant

## 2018-08-10 DIAGNOSIS — F909 Attention-deficit hyperactivity disorder, unspecified type: Secondary | ICD-10-CM

## 2018-08-10 MED ORDER — AMPHETAMINE-DEXTROAMPHET ER 20 MG PO CP24: 20.0000 mg | ORAL_CAPSULE | Freq: Every day | ORAL | 0 refills | Status: DC

## 2018-08-10 MED ORDER — AMPHETAMINE-DEXTROAMPHETAMINE 20 MG PO TABS: 20.0000 mg | ORAL_TABLET | Freq: Every day | ORAL | 0 refills | Status: DC

## 2018-08-25 ENCOUNTER — Telehealth: Payer: Self-pay | Admitting: *Deleted

## 2018-08-25 NOTE — Telephone Encounter (Signed)
Opened in error

## 2018-09-06 ENCOUNTER — Telehealth: Payer: Self-pay | Admitting: Physician Assistant

## 2018-09-06 DIAGNOSIS — F909 Attention-deficit hyperactivity disorder, unspecified type: Secondary | ICD-10-CM

## 2018-09-06 NOTE — Telephone Encounter (Signed)
Copied from CRM [id number]. Topic: Quick Communication - Rx Refill/Question >> Sep 06, 2018  3:21 PM Neomia Dearemaray, Efraim KaufmannMelissa wrote: Medication: amphetamine-dextroamphetamine (ADDERALL XR) 20 MG 24 hr capsule   Has the patient contacted their pharmacy? No. (Agent: If no, request that the patient contact the pharmacy for the refill.) (Agent: If yes, when and what did the pharmacy advise?)  Preferred Pharmacy (with phone number or street name):   Agent: Please be advised that RX refills may take up to 3 business days. We ask that you follow-up with your pharmacy.

## 2018-09-07 NOTE — Telephone Encounter (Signed)
Please advise 

## 2018-09-08 NOTE — Telephone Encounter (Signed)
° ° °  Pt is following up on her request for her refill

## 2018-09-09 NOTE — Telephone Encounter (Signed)
Whitney McVey is unavailable. Pt is calling to f/up on request.  Please advise.

## 2018-09-10 MED ORDER — AMPHETAMINE-DEXTROAMPHET ER 20 MG PO CP24: 20.0000 mg | ORAL_CAPSULE | Freq: Every day | ORAL | 0 refills | Status: DC

## 2018-09-10 NOTE — Telephone Encounter (Signed)
Pt is calling to check the status of her refill.  Please advise

## 2018-09-12 ENCOUNTER — Telehealth: Payer: Self-pay | Admitting: *Deleted

## 2018-09-12 ENCOUNTER — Ambulatory Visit: Payer: Self-pay | Admitting: *Deleted

## 2018-09-12 NOTE — Telephone Encounter (Signed)
Pt stated she has called several times about her refill on Adderall. She is out of medication  Call to pharmacy and they are not showing that they received Rx on 09/10/18. Can some one check on that Rx.

## 2018-09-12 NOTE — Telephone Encounter (Signed)
Medication: amphetamine-dextroamphetamine (ADDERALL XR) 20 MG 24 hr capsule   Has the patient contacted their pharmacy? No. (Agent: If no, request that the patient contact the pharmacy for the refill.) (Agent: If yes, when and what did the pharmacy advise?)  Preferred Pharmacy (with phone number or street name):   Agent: Please be advised that RX refills may take up to 3 business days. We ask that you follow-up with your pharmacy.   Patient of mcvey last office visit was 05-2018

## 2018-09-14 NOTE — Telephone Encounter (Signed)
Patient calling to check the status of getting her amphetamine-dextroamphetamine (ADDERALL) 20 MG tablet. States that she has called or sent a message for the past 10 days and this has not been sent. Would like to get this taken care of today and is also requesting next month's prescriptions as well due to it taking over 10 days to get this prescription refilled for this month. Please advise.

## 2018-09-15 ENCOUNTER — Other Ambulatory Visit: Payer: Self-pay | Admitting: Physician Assistant

## 2018-09-15 DIAGNOSIS — F909 Attention-deficit hyperactivity disorder, unspecified type: Secondary | ICD-10-CM

## 2018-09-15 MED ORDER — AMPHETAMINE-DEXTROAMPHET ER 20 MG PO CP24: 20.0000 mg | ORAL_CAPSULE | Freq: Every day | ORAL | 0 refills | Status: DC

## 2018-09-15 MED ORDER — AMPHETAMINE-DEXTROAMPHETAMINE 20 MG PO TABS: 20.0000 mg | ORAL_TABLET | Freq: Every day | ORAL | 0 refills | Status: DC

## 2018-09-15 NOTE — Telephone Encounter (Signed)
Pt called today stating that the adderall rx that shows in system was sent to [facility] on 09/10/18 was not received at the pharmacy - spoke with Doyne Keelhanda at the office and she will be checking with Mcvey in an hour and see about resending the adderall rx -pt  Is aware and states she will check back with in the hour to see that it has been called in since she has been trying to get this filled since 09/06/18

## 2018-11-07 ENCOUNTER — Other Ambulatory Visit: Payer: Self-pay | Admitting: Physician Assistant

## 2018-11-07 DIAGNOSIS — F411 Generalized anxiety disorder: Secondary | ICD-10-CM

## 2018-11-08 NOTE — Telephone Encounter (Signed)
Pt. Needs OV. Mailbox is full, unable to leave message.

## 2018-11-10 ENCOUNTER — Ambulatory Visit: Payer: Medicaid Other | Admitting: Emergency Medicine

## 2018-11-11 ENCOUNTER — Ambulatory Visit: Payer: Self-pay

## 2018-11-11 DIAGNOSIS — F411 Generalized anxiety disorder: Secondary | ICD-10-CM

## 2018-11-11 MED ORDER — VENLAFAXINE HCL ER 150 MG PO CP24: ORAL_CAPSULE | ORAL | 0 refills | Status: DC

## 2018-11-11 NOTE — Telephone Encounter (Signed)
Pt. Stated she ran out of her Effexor about 3 days ago.  Stated she gets "a feeling like her head is in a bubble," when she doesn't take her Effexor.  Denied any other Neuro. changes; denied dizziness, speech changes, weakness in unilateral extremities, or any other symptoms.  Was scheduled for appt. yesterday, and due to the weather, was not able to keep the appt.   Rescheduled appt. to 12/15/18.  Has requested to get enough medication to get through to the appt.   Per Protocol, will refill a courtesy refill of #30, no refills.      Reason for Disposition . Caller has medication question, adult has minor symptoms, caller declines triage, and triager answers question    C/o intermittent dizziness since she has run out of her Effexor.  Stated this is a side effect she has experienced previously when she ran out of the Effexor.  Answer Assessment - Initial Assessment Questions 1. SYMPTOMS: "Do you have any symptoms?"     Described feeling like head is in a bubble;  Stated this happens when she doesn't take her Effexor.    2. SEVERITY: If symptoms are present, ask "Are they mild, moderate or severe?"     *No Answer*  Protocols used: MEDICATION QUESTION CALL-A-AH  Message from Gerrianne Scale sent at 11/11/2018 2:54 PM EST   Summary: dizziness   Pt calling back about needing a refill on effexor she will call on Monday for an appt she is needing a few pills until then ----- Message from Jay Schlichter sent at 11/11/2018 2:34 PM EST ----- Pt is taking efexxor and has been out . She is now having dizziness. She missed yesterdays appt to have refill because of the weather.

## 2018-12-15 ENCOUNTER — Ambulatory Visit: Payer: Self-pay | Admitting: Emergency Medicine

## 2019-01-11 ENCOUNTER — Telehealth: Payer: Self-pay | Admitting: Emergency Medicine

## 2019-01-11 NOTE — Telephone Encounter (Signed)
Called pt left VM to CB to  convert upcoming appt to telemed or Webex   FR

## 2019-01-16 ENCOUNTER — Other Ambulatory Visit: Payer: Self-pay

## 2019-01-16 ENCOUNTER — Telehealth: Payer: Medicaid Other | Admitting: Emergency Medicine

## 2019-01-16 ENCOUNTER — Telehealth: Payer: Self-pay | Admitting: *Deleted

## 2019-01-16 NOTE — Telephone Encounter (Signed)
Called patient to triage for Telemed visit and call was forwarded to voice mail. Left message to call back to be triaged.

## 2019-01-17 ENCOUNTER — Other Ambulatory Visit: Payer: Self-pay

## 2019-01-17 ENCOUNTER — Other Ambulatory Visit: Payer: Self-pay | Admitting: General Practice

## 2019-01-17 ENCOUNTER — Other Ambulatory Visit: Payer: Self-pay | Admitting: Family Medicine

## 2019-01-17 ENCOUNTER — Ambulatory Visit: Payer: Self-pay | Admitting: *Deleted

## 2019-01-17 ENCOUNTER — Telehealth (INDEPENDENT_AMBULATORY_CARE_PROVIDER_SITE_OTHER): Payer: Medicaid Other | Admitting: Family Medicine

## 2019-01-17 DIAGNOSIS — F411 Generalized anxiety disorder: Secondary | ICD-10-CM

## 2019-01-17 DIAGNOSIS — F909 Attention-deficit hyperactivity disorder, unspecified type: Secondary | ICD-10-CM | POA: Insufficient documentation

## 2019-01-17 MED ORDER — AMPHETAMINE-DEXTROAMPHETAMINE 20 MG PO TABS: 20.0000 mg | ORAL_TABLET | Freq: Every day | ORAL | 0 refills | Status: DC

## 2019-01-17 MED ORDER — AMPHETAMINE-DEXTROAMPHET ER 20 MG PO CP24: 20.0000 mg | ORAL_CAPSULE | Freq: Every day | ORAL | 0 refills | Status: DC

## 2019-01-17 MED ORDER — VENLAFAXINE HCL ER 150 MG PO CP24: ORAL_CAPSULE | ORAL | 0 refills | Status: DC

## 2019-01-17 NOTE — Telephone Encounter (Signed)
Pt states had Telemedicine visit today with Dr. Judee Clara. States medications were not called in to pharmacy yet: Adderall 20mg  and adderall XR. States calling to make sure they will be called in and to the right pharmacy: [facility] on [address] [phone]

## 2019-01-17 NOTE — Patient Instructions (Signed)
Make an appt for face-to-face visit in May-important to have office visit since none since provider left to re-establish a provider and discuss how to avoid extra doses of short acting adderall.

## 2019-01-17 NOTE — Progress Notes (Signed)
Telemedicine Encounter- SOAP NOTE Established Patient  I discussed the limitations, risks, security and privacy concerns of performing an evaluation and management service by telephone and the availability of in person appointments. I also discussed with the patient that there may be a patient responsible charge related to this service. The patient expressed understanding and agreed to proceed.  This telephone encounter was conducted with the patient's (or proxy's) verbal consent via audio telecommunications: yes Patient was instructed to have this encounter in a suitably private space; and to only have persons present to whom they give permission to participate. In addition, patient identity was confirmed by use of name plus two identifiers (DOB and address).  I spent a total of talking with the patient   Pt needs to follow-up for Rx refills. She is requesting a 3 month supply  Subjective     Crystal Webb is a 43 y.o. female established patient. Telephone visit today for refills on medications for anxiety and ADHD  HPI  ADHD since age 9 Anxiety long term  Pt states she needs refill on medication. Pt ran out of Effexor and pharmacy filled. Pt states whenshe was out of medication 1 month she experienced  withdrawal symptoms and difficulty dealing with work due to lack of concentration and anxiety.Her boss contacted her to see why work has not been completed. Pt states she takes long acting Adderall in the morning and short acting in the later morning and afternoon to stay focused.(only prescribed for one additional dose) Pt does try to exercise in the morning. Pt often has trouble falling asleep but once asleep is able to sleep through the night without difficulty.  Pt drinks a glass of wine 1-2 times a week. Pt does listen to music and reads to help with sleep. pts provider has left practice and she has experienced difficulty getting meds refilled Pt has not been seen in clinic  for recheck  Current Outpatient Medications  Medication Sig Dispense Refill  . venlafaxine XR (EFFEXOR-XR) 150 MG 24 hr capsule TAKE 1 CAPSULE BY MOUTH DAILY WITH BREAKFAST 90 capsule 0  . amphetamine-dextroamphetamine (ADDERALL XR) 20 MG 24 hr capsule Take 1 capsule (20 mg total) by mouth daily. 30 capsule 0  . amphetamine-dextroamphetamine (ADDERALL) 20 MG tablet Take 1 tablet (20 mg total) by mouth daily. 30 tablet 0   No current facility-administered medications for this visit.     No Known Allergies  Social History   Socioeconomic History  . Marital status: Single    Spouse name: Not on file  . Number of children: Not on file  . Years of education: Not on file  . Highest education level: Not on file  Occupational History  . Not on file  Social Needs  . Financial resource strain: Not on file  . Food insecurity:    Worry: Not on file    Inability: Not on file  . Transportation needs:    Medical: Not on file    Non-medical: Not on file  Tobacco Use  . Smoking status: Current Every Day Smoker  . Smokeless tobacco: Never Used  Substance and Sexual Activity  . Alcohol use: No  . Drug use: No  . Sexual activity: Not on file  Lifestyle  . Physical activity:    Days per week: Not on file    Minutes per session: Not on file  . Stress: Not on file  Relationships  . Social connections:    Talks on phone: Not  on file    Gets together: Not on file    Attends religious service: Not on file    Active member of club or organization: Not on file    Attends meetings of clubs or organizations: Not on file    Relationship status: Not on file  . Intimate partner violence:    Fear of current or ex partner: Not on file    Emotionally abused: Not on file    Physically abused: Not on file    Forced sexual activity: Not on file  Other Topics Concern  . Not on file  Social History Narrative  . Not on file  Lives with boy friend-safe supportive relationship Has supportive  family-zoom meeting every afternoon to check in on family  ROS CONSTITUTIONAL: no weight lossNEURO:frequent headaches, double vision, weakness, change in sensation, problems with walking or balance, dizziness, tremor, loss of consciousness, episodes of visual loss PSY: Insomnia-difficulty getting to sleep,  anxiety  Objective  No vital signs-pt visit by telephone  1. Anxiety state-refilled, suggested exercise in the morning, during the day with difficulty with focus and in the evening-music,  avoid TV/radio- venlafaxine XR (EFFEXOR-XR) 150 MG 24 hr capsule; TAKE 1 CAPSULE BY MOUTH DAILY WITH BREAKFAST  Dispense: 90 capsule; Refill: 0  2. Attention deficit hyperactivity disorder (ADHD), unspecified ADHD type Continue XR adderall taken in the morning-suggested morning exercise to promote focus. Avoid extra dose of adderall at 10 taking at noon to promote afternoon concentration and avoid difficulty falling asleep at night.   I discussed the assessment and treatment plan with the patient. The patient was provided an opportunity to ask questions and all were answered. The patient agreed with the plan and demonstrated an understanding of the instructions.   The patient was advised to call back for  in-person evaluation IN MAY PRIOR TO ADDITIONAL REFILLS ON ADDERALL. Pt needs a controlled substances contract, exam/evaluation to continued rx for medication.    I provided 35 minutes of non-face-to-face time during this encounter.  LISA Mat CarneLEIGH CORUM, MD  [facility] at [facility] 01-17-19

## 2019-01-17 NOTE — Progress Notes (Signed)
Pt needs to follow-up for Rx refills. She is requesting a 3 month supply.

## 2019-01-18 NOTE — Telephone Encounter (Signed)
Medications was sent to pharmacy.  

## 2019-02-14 ENCOUNTER — Telehealth: Payer: Self-pay | Admitting: *Deleted

## 2019-02-14 ENCOUNTER — Other Ambulatory Visit: Payer: Self-pay | Admitting: Family Medicine

## 2019-02-14 ENCOUNTER — Telehealth: Payer: Self-pay | Admitting: Family Medicine

## 2019-02-14 DIAGNOSIS — F909 Attention-deficit hyperactivity disorder, unspecified type: Secondary | ICD-10-CM

## 2019-02-14 MED ORDER — AMPHETAMINE-DEXTROAMPHET ER 20 MG PO CP24: 20.0000 mg | ORAL_CAPSULE | Freq: Every day | ORAL | 0 refills | Status: DC

## 2019-02-14 MED ORDER — AMPHETAMINE-DEXTROAMPHETAMINE 20 MG PO TABS: 20.0000 mg | ORAL_TABLET | Freq: Every day | ORAL | 0 refills | Status: DC

## 2019-02-14 NOTE — Telephone Encounter (Signed)
Refill adderall

## 2019-02-14 NOTE — Telephone Encounter (Signed)
Crystal Webb would like a prescription of adderall xr 20 mg and 20 mg it was filled 01-17-2019 requesting another refill, she has an appointment with Jari Sportsman for transfer of care on Monday but will be out before then

## 2019-02-14 NOTE — Telephone Encounter (Signed)
Patient is returning a call to [facility] regarding her prescription.  Please return the call at [phone]

## 2019-02-15 NOTE — Telephone Encounter (Signed)
Spoke with pt and she states she was ok and did not need anything else at this time.

## 2019-02-20 ENCOUNTER — Encounter: Payer: Medicaid Other | Admitting: Registered Nurse

## 2019-03-17 ENCOUNTER — Encounter: Payer: Medicaid Other | Admitting: Registered Nurse

## 2019-03-17 ENCOUNTER — Telehealth: Payer: Self-pay | Admitting: Registered Nurse

## 2019-03-17 NOTE — Telephone Encounter (Signed)
Pt cancelled her appointment 03/17/19  for TOC to [place] today. She has rescheduled to [place] on  03/29/19. She would like a refill to cover her until her appointment.  amphetamine-dextroamphetamine (ADDERALL) 20 MG tablet [[id number]]. Please advise at [phone]

## 2019-03-17 NOTE — Telephone Encounter (Signed)
Could you please send this pt a refill.

## 2019-03-20 ENCOUNTER — Other Ambulatory Visit: Payer: Self-pay | Admitting: Registered Nurse

## 2019-03-20 DIAGNOSIS — F909 Attention-deficit hyperactivity disorder, unspecified type: Secondary | ICD-10-CM

## 2019-03-20 MED ORDER — AMPHETAMINE-DEXTROAMPHET ER 20 MG PO CP24: 20.0000 mg | ORAL_CAPSULE | Freq: Every day | ORAL | 0 refills | Status: DC

## 2019-03-20 NOTE — Progress Notes (Signed)
Courtesy refill sent - Pt has upcoming appt on 03/29/19 for TOC and med refill  Kathrin Ruddy, NP

## 2019-03-20 NOTE — Telephone Encounter (Signed)
Refill will be sent, pt should be advised that she must have an appt before her next refill, as she has canceled/no show for multiple appointments at this point.  Kathrin Ruddy, NP

## 2019-03-21 NOTE — Telephone Encounter (Signed)
Patient needs amphetamine-dextroamphetamine (ADDERALL) 20 MG tablet sent in too. She takes the XR which was sent and also needs one that is regular.

## 2019-03-21 NOTE — Telephone Encounter (Signed)
adderall 20 mg filled by [facility] on 03/20/2019

## 2019-03-23 ENCOUNTER — Other Ambulatory Visit: Payer: Self-pay | Admitting: Registered Nurse

## 2019-03-23 DIAGNOSIS — F909 Attention-deficit hyperactivity disorder, unspecified type: Secondary | ICD-10-CM

## 2019-03-23 MED ORDER — AMPHETAMINE-DEXTROAMPHETAMINE 20 MG PO TABS: 20.0000 mg | ORAL_TABLET | Freq: Every day | ORAL | 0 refills | Status: DC

## 2019-03-23 NOTE — Telephone Encounter (Signed)
Could you please seen the 20mg  adderall you already sent the XR on 03/20/2019

## 2019-03-23 NOTE — Telephone Encounter (Signed)
Patient calling in stating she is still missing amphetamine-dextroamphetamine (ADDERALL) 20 MG tablet. Patient is requesting this be done soon if possible.

## 2019-03-23 NOTE — Progress Notes (Signed)
Courtesy refill sent. Pt needs appointment for further refills.  Kathrin Ruddy, NP

## 2019-03-23 NOTE — Telephone Encounter (Signed)
Sent over this refill. Patient will need an appointment for further refills. Thank you  Kathrin Ruddy, NP

## 2019-03-29 ENCOUNTER — Other Ambulatory Visit: Payer: Self-pay

## 2019-03-29 ENCOUNTER — Ambulatory Visit (INDEPENDENT_AMBULATORY_CARE_PROVIDER_SITE_OTHER): Payer: Medicaid Other | Admitting: Registered Nurse

## 2019-03-29 ENCOUNTER — Encounter: Payer: Self-pay | Admitting: Registered Nurse

## 2019-03-29 DIAGNOSIS — F411 Generalized anxiety disorder: Secondary | ICD-10-CM

## 2019-03-29 MED ORDER — VENLAFAXINE HCL ER 150 MG PO CP24: ORAL_CAPSULE | ORAL | 3 refills | Status: DC
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
## 2019-03-29 NOTE — Patient Instructions (Signed)
° ° ° °  If you have lab work done today you will be contacted with your lab results within the next 2 weeks.  If you have not heard from us then please contact us. The fastest way to get your results is to register for My Chart. ° ° °IF you received an x-ray today, you will receive an invoice from [facility]. Please contact [facility] at [phone] with questions or concerns regarding your invoice.  ° °IF you received labwork today, you will receive an invoice from [facility]. Please contact [facility] at [phone] with questions or concerns regarding your invoice.  ° °Our billing staff will not be able to assist you with questions regarding bills from these companies. ° °You will be contacted with the lab results as soon as they are available. The fastest way to get your results is to activate your My Chart account. Instructions are located on the last page of this paperwork. If you have not heard from us regarding the results in 2 weeks, please contact this office. °  ° ° ° °

## 2019-04-20 ENCOUNTER — Other Ambulatory Visit: Payer: Self-pay | Admitting: Registered Nurse

## 2019-04-20 ENCOUNTER — Telehealth: Payer: Self-pay | Admitting: Registered Nurse

## 2019-04-20 DIAGNOSIS — F909 Attention-deficit hyperactivity disorder, unspecified type: Secondary | ICD-10-CM

## 2019-04-20 MED ORDER — AMPHETAMINE-DEXTROAMPHETAMINE 20 MG PO TABS: 20.0000 mg | ORAL_TABLET | Freq: Every day | ORAL | 0 refills | Status: DC

## 2019-04-20 MED ORDER — AMPHETAMINE-DEXTROAMPHET ER 20 MG PO CP24: 20.0000 mg | ORAL_CAPSULE | Freq: Every day | ORAL | 0 refills | Status: DC

## 2019-04-20 NOTE — Progress Notes (Signed)
Refills sent. Pt has follow up appt booked in September. Ok to refill until then   Kathrin Ruddy, NP

## 2019-04-20 NOTE — Telephone Encounter (Signed)
Medication Refill - Medication: amphetamine-dextroamphetamine (ADDERALL) 20 MG tablet/amphetamine-dextroamphetamine (ADDERALL XR) 20 MG 24 hr capsule/Pt stated she is out of medication and needs it refilled today because she is going out of town tomorrow  Has the patient contacted their pharmacy? Yes.   (Agent: If no, request that the patient contact the pharmacy for the refill.) (Agent: If yes, when and what did the pharmacy advise?)  Preferred Pharmacy (with phone number or street name):  [facility] DR[facility] STORE [place], [address] AT [place] [phone] (Phone) [phone] (Fax)     Agent: Please be advised that RX refills may take up to 3 business days. We ask that you follow-up with your pharmacy.

## 2019-05-29 ENCOUNTER — Encounter: Payer: Self-pay | Admitting: Registered Nurse

## 2019-05-29 ENCOUNTER — Other Ambulatory Visit: Payer: Self-pay | Admitting: Registered Nurse

## 2019-05-29 ENCOUNTER — Telehealth: Payer: Self-pay | Admitting: Registered Nurse

## 2019-05-29 DIAGNOSIS — F909 Attention-deficit hyperactivity disorder, unspecified type: Secondary | ICD-10-CM

## 2019-05-29 MED ORDER — AMPHETAMINE-DEXTROAMPHET ER 30 MG PO CP24: 30.0000 mg | ORAL_CAPSULE | ORAL | 0 refills | Status: DC

## 2019-05-29 NOTE — Telephone Encounter (Signed)
Patient is calling to check on the status of her medication refill. Patient states that she previously would 3 months worth of refills. Please advise CB- [phone]

## 2019-05-29 NOTE — Telephone Encounter (Signed)
Pt called again to see if the Rx was sent to the pharmacy. Pt stated she will call again to check in a few/ please advise

## 2019-05-29 NOTE — Telephone Encounter (Signed)
Medication Refill - Medication: amphetamine-dextroamphetamine (ADDERALL) 20 MG tablet   amphetamine-dextroamphetamine (ADDERALL XR) 20 MG 24 hr capsule Pt is out of medication and Pt stated that this Rx for extended release should be 30 Mg/ please advise    Has the patient contacted their pharmacy? No. (Agent: If no, request that the patient contact the pharmacy for the refill.) (Agent: If yes, when and what did the pharmacy advise?)  Preferred Pharmacy (with phone number or street name):  [facility]ORE [place], [address] AT [place] [phone] (Phone) [phone] (Fax)     Agent: Please be advised that RX refills may take up to 3 business days. We ask that you follow-up with your pharmacy.

## 2019-05-29 NOTE — Telephone Encounter (Signed)
Spoke with pt an she advises she and Orland Mustard spoke about increasing adderall to 30 mg xr. Pt would like 30 day supply sent over 3 times (placed on file by pharmacy) so she doesn't have to call every month for refill she can just call the pharmacy.  Advised will send message and contact her back with response.  Pt agreeable.  Dgaddy, CMA

## 2019-05-30 ENCOUNTER — Other Ambulatory Visit: Payer: Self-pay | Admitting: Registered Nurse

## 2019-05-30 DIAGNOSIS — F909 Attention-deficit hyperactivity disorder, unspecified type: Secondary | ICD-10-CM

## 2019-05-30 MED ORDER — AMPHETAMINE-DEXTROAMPHETAMINE 20 MG PO TABS: 20.0000 mg | ORAL_TABLET | Freq: Two times a day (BID) | ORAL | 0 refills | Status: DC

## 2019-05-30 NOTE — Telephone Encounter (Signed)
Pt called in, she said that the Rx for amphetamine-dextroamphetamine (ADDERALL) 20 MG tablet was not sent in only the extended relief.   Please assist.

## 2019-05-30 NOTE — Telephone Encounter (Signed)
Please advise 

## 2019-06-29 ENCOUNTER — Other Ambulatory Visit: Payer: Self-pay | Admitting: Registered Nurse

## 2019-06-29 DIAGNOSIS — F909 Attention-deficit hyperactivity disorder, unspecified type: Secondary | ICD-10-CM

## 2019-06-29 NOTE — Telephone Encounter (Signed)
Requested medication (s) are due for refill today: yes  Requested medication (s) are on the active medication list: yes  Last refill:  05/29/2019  Future visit scheduled: yes  Notes to clinic: refill cannot be delegated   Requested Prescriptions  Pending Prescriptions Disp Refills   amphetamine-dextroamphetamine (ADDERALL XR) 30 MG 24 hr capsule 30 capsule 0    Sig: Take 1 capsule (30 mg total) by mouth every morning.     Not Delegated - Psychiatry:  Stimulants/ADHD Failed - 06/29/2019  3:05 PM      Failed - This refill cannot be delegated      Failed - Urine Drug Screen completed in last 360 days.      Failed - Valid encounter within last 3 months    Recent Outpatient Visits          3 months ago Anxiety state   [facility] at [place], NP   5 months ago Anxiety state   [facility] at [place], MD   1 year ago Attention deficit hyperactivity disorder (ADHD), unspecified ADHD type   Primary Care at [facility], Gelene Mink, PA-C   1 year ago Attention deficit hyperactivity disorder (ADHD), unspecified ADHD type   Primary Care at [facility], Gelene Mink, PA-C   1 year ago Attention deficit hyperactivity disorder (ADHD), unspecified ADHD type   Primary Care at [facility], Gelene Mink, PA-C      Future Appointments            In 5 days Maximiano Coss, NP [facility] at [place], [facility]            amphetamine-dextroamphetamine (ADDERALL) 20 MG tablet 60 tablet 0    Sig: Take 1 tablet (20 mg total) by mouth 2 (two) times daily.     Not Delegated - Psychiatry:  Stimulants/ADHD Failed - 06/29/2019  3:05 PM      Failed - This refill cannot be delegated      Failed - Urine Drug Screen completed in last 360 days.      Failed - Valid encounter within last 3 months    Recent Outpatient Visits          3 months ago Anxiety state   [facility] at [place], NP   5 months ago Anxiety state   [facility] at [place], MD   1 year ago Attention deficit hyperactivity disorder (ADHD), unspecified ADHD type   Primary Care at [facility], Gelene Mink, PA-C   1 year ago Attention deficit hyperactivity disorder (ADHD), unspecified ADHD type   Primary Care at [facility], Gelene Mink, PA-C   1 year ago Attention deficit hyperactivity disorder (ADHD), unspecified ADHD type   Primary Care at [facility], Gelene Mink, PA-C      Future Appointments            In 5 days Maximiano Coss, NP Primary [facility] at [place], [facility]

## 2019-06-29 NOTE — Telephone Encounter (Signed)
Copied from [place] [id number]. Topic: Quick Communication - Rx Refill/Question >> Jun 29, 2019  1:38 PM Mcneil, Ja-Kwan wrote: Medication: amphetamine-dextroamphetamine (ADDERALL XR) 30 MG 24 hr capsule and amphetamine-dextroamphetamine (ADDERALL) 20 MG tablet  Has the patient contacted their pharmacy? no  Preferred Pharmacy (with phone number or street name): [facility] DR[facility] STORE [place], [address] AT [address] [place] [phone] (Phone)  [phone] (Fax)  Agent: Please be advised that RX refills may take up to 3 business days. We ask that you follow-up with your pharmacy.

## 2019-06-29 NOTE — Telephone Encounter (Signed)
Requested medication (s) are due for refill today: yes  Requested medication (s) are on the active medication list: yes  Last refill:  05/29/2019  Future visit scheduled: yes  Notes to clinic:  Refill cannot be delegated    Requested Prescriptions  Pending Prescriptions Disp Refills   amphetamine-dextroamphetamine (ADDERALL XR) 30 MG 24 hr capsule 30 capsule 0    Sig: Take 1 capsule (30 mg total) by mouth every morning.     Not Delegated - Psychiatry:  Stimulants/ADHD Failed - 06/29/2019  1:45 PM      Failed - This refill cannot be delegated      Failed - Urine Drug Screen completed in last 360 days.      Failed - Valid encounter within last 3 months    Recent Outpatient Visits          3 months ago Anxiety state   [facility] at [place], NP   5 months ago Anxiety state   [facility] at [place], MD   1 year ago Attention deficit hyperactivity disorder (ADHD), unspecified ADHD type   Primary Care at [facility], Gelene Mink, PA-C   1 year ago Attention deficit hyperactivity disorder (ADHD), unspecified ADHD type   Primary Care at [facility], Gelene Mink, PA-C   1 year ago Attention deficit hyperactivity disorder (ADHD), unspecified ADHD type   Primary Care at [facility], Gelene Mink, PA-C      Future Appointments            In 5 days Maximiano Coss, NP Primary Care at [place], [facility]

## 2019-06-29 NOTE — Telephone Encounter (Signed)
Requested Prescriptions   Pending Prescriptions Disp Refills  . amphetamine-dextroamphetamine (ADDERALL XR) 30 MG 24 hr capsule 30 capsule 0    Sig: Take 1 capsule (30 mg total) by mouth every morning.  Marland Kitchen amphetamine-dextroamphetamine (ADDERALL) 20 MG tablet 60 tablet 0    Sig: Take 1 tablet (20 mg total) by mouth 2 (two) times daily.    Last OV 03/29/2019  Last written 05/29/2019

## 2019-06-29 NOTE — Telephone Encounter (Signed)
amphetamine-dextroamphetamine (ADDERALL XR) 30 MG 24 hr capsule  amphetamine-dextroamphetamine (ADDERALL) 20 MG tablet  Pt has an appt on 9/29!  [facility] DRUG STORE [place], [address] [place] [phone] (Phone) [phone] (Fax)

## 2019-06-29 NOTE — Telephone Encounter (Signed)
Requested Prescriptions   Pending Prescriptions Disp Refills  . amphetamine-dextroamphetamine (ADDERALL XR) 30 MG 24 hr capsule 30 capsule 0    Sig: Take 1 capsule (30 mg total) by mouth every morning.    Last OV 03/29/2019  Last written 05/29/2019

## 2019-06-30 MED ORDER — AMPHETAMINE-DEXTROAMPHETAMINE 20 MG PO TABS: 20.0000 mg | ORAL_TABLET | Freq: Two times a day (BID) | ORAL | 0 refills | Status: DC

## 2019-06-30 MED ORDER — AMPHETAMINE-DEXTROAMPHET ER 30 MG PO CP24: 30.0000 mg | ORAL_CAPSULE | ORAL | 0 refills | Status: DC

## 2019-07-04 ENCOUNTER — Ambulatory Visit: Payer: Medicaid Other | Admitting: Registered Nurse

## 2019-07-05 ENCOUNTER — Encounter: Payer: Self-pay | Admitting: Registered Nurse

## 2019-08-03 ENCOUNTER — Telehealth: Payer: Self-pay | Admitting: Registered Nurse

## 2019-08-03 DIAGNOSIS — F909 Attention-deficit hyperactivity disorder, unspecified type: Secondary | ICD-10-CM

## 2019-08-03 NOTE — Telephone Encounter (Signed)
Requested medication (s) are due for refill today: yes  Requested medication (s) are on the active medication list: yes  Last refill:  06/30/2019  Future visit scheduled: no  Notes to clinic:  Refill cannot be delegated    Requested Prescriptions  Pending Prescriptions Disp Refills   amphetamine-dextroamphetamine (ADDERALL XR) 30 MG 24 hr capsule 30 capsule 0    Sig: Take 1 capsule (30 mg total) by mouth every morning.     Not Delegated - Psychiatry:  Stimulants/ADHD Failed - 08/03/2019 12:32 PM      Failed - This refill cannot be delegated      Failed - Urine Drug Screen completed in last 360 days.      Failed - Valid encounter within last 3 months    Recent Outpatient Visits          4 months ago Anxiety state   [facility] at [place], NP   6 months ago Anxiety state   [facility] at [place], MD   1 year ago Attention deficit hyperactivity disorder (ADHD), unspecified ADHD type   Primary Care at [facility], Gelene Mink, PA-C   1 year ago Attention deficit hyperactivity disorder (ADHD), unspecified ADHD type   Primary Care at [facility] Gelene Mink, PA-C   1 year ago Attention deficit hyperactivity disorder (ADHD), unspecified ADHD type   Primary Care at [facility], Gelene Mink, PA-C              amphetamine-dextroamphetamine (ADDERALL) 20 MG tablet 60 tablet 0    Sig: Take 1 tablet (20 mg total) by mouth 2 (two) times daily.     Not Delegated - Psychiatry:  Stimulants/ADHD Failed - 08/03/2019 12:32 PM      Failed - This refill cannot be delegated      Failed - Urine Drug Screen completed in last 360 days.      Failed - Valid encounter within last 3 months    Recent Outpatient Visits          4 months ago Anxiety state   [facility] at [place], NP   6 months ago Anxiety state   [facility] at [place], MD   1 year ago Attention deficit hyperactivity disorder (ADHD), unspecified  ADHD type   Primary Care at [facility], Gelene Mink, PA-C   1 year ago Attention deficit hyperactivity disorder (ADHD), unspecified ADHD type   Primary Care at [facility], Gelene Mink, PA-C   1 year ago Attention deficit hyperactivity disorder (ADHD), unspecified ADHD type   Primary Care at [facility], Gelene Mink, [place]

## 2019-08-03 NOTE — Telephone Encounter (Signed)
Medication Refill - Medication:  amphetamine-dextroamphetamine (ADDERALL) 20 MG tablet  amphetamine-dextroamphetamine (ADDERALL XR) 30 MG 24 hr capsule  Has the patient contacted their pharmacy? Yes advised to call office.   Preferred Pharmacy (with phone number or street name):  [facility] DR[facility] STORE [place], [address] AT [place] [phone] (Phone) [phone] (Fax)    Agent: Please be advised that RX refills may take up to 3 business days. We ask that you follow-up with your pharmacy.

## 2019-08-04 ENCOUNTER — Ambulatory Visit: Payer: Medicaid Other | Admitting: Registered Nurse

## 2019-08-07 ENCOUNTER — Other Ambulatory Visit: Payer: Self-pay

## 2019-08-07 ENCOUNTER — Telehealth (INDEPENDENT_AMBULATORY_CARE_PROVIDER_SITE_OTHER): Payer: Medicaid Other | Admitting: Registered Nurse

## 2019-08-07 DIAGNOSIS — F909 Attention-deficit hyperactivity disorder, unspecified type: Secondary | ICD-10-CM

## 2019-08-07 MED ORDER — AMPHETAMINE-DEXTROAMPHET ER 30 MG PO CP24: 30.0000 mg | ORAL_CAPSULE | ORAL | 0 refills | Status: DC

## 2019-08-07 MED ORDER — AMPHETAMINE-DEXTROAMPHETAMINE 20 MG PO TABS: 20.0000 mg | ORAL_TABLET | Freq: Two times a day (BID) | ORAL | 0 refills | Status: DC

## 2019-08-07 NOTE — Progress Notes (Signed)
CC: Patient states that she would like a refill of adderall xr 30 mg and adderall 20 mg.

## 2019-08-07 NOTE — Telephone Encounter (Signed)
Pt called for an update on the refill request. Pt stated she has been out of the medications since 07/30/19. Pt requests call back

## 2019-08-07 NOTE — Progress Notes (Signed)
Telemedicine Encounter- SOAP NOTE Established Patient  This telephone encounter was conducted with the patient's (or proxy's) verbal consent via audio telecommunications: yes  Patient was instructed to have this encounter in a suitably private space; and to only have persons present to whom they give permission to participate. In addition, patient identity was confirmed by use of name plus two identifiers (DOB and address).  I discussed the limitations, risks, security and privacy concerns of performing an evaluation and management service by telephone and the availability of in person appointments. I also discussed with the patient that there may be a patient responsible charge related to this service. The patient expressed understanding and agreed to proceed.  I spent a total of 11 minutes talking with the patient or their proxy.  No chief complaint on file.   Subjective   Crystal Webb is a 43 y.o. established patient. Telephone visit today for medication refills  HPI Takes Adderall 30mg  ER PO qd and Adderall 20mg  PO bid for ADHD. Good effect. No concerns for palpitations, chest pain, headache, visual changes, sleep disturbance, NVD, weight changes. No new medical updates since last visit. Feels that things are stable at this time.   Patient Active Problem List   Diagnosis Date Noted  . Anxiety state 01/17/2019  . Attention deficit hyperactivity disorder (ADHD) 01/17/2019    Past Medical History:  Diagnosis Date  . ADD (attention deficit disorder)     Current Outpatient Medications  Medication Sig Dispense Refill  . amphetamine-dextroamphetamine (ADDERALL XR) 30 MG 24 hr capsule Take 1 capsule (30 mg total) by mouth every morning. 30 capsule 0  . amphetamine-dextroamphetamine (ADDERALL) 20 MG tablet Take 1 tablet (20 mg total) by mouth 2 (two) times daily. 60 tablet 0  . venlafaxine XR (EFFEXOR-XR) 150 MG 24 hr capsule TAKE 1 CAPSULE BY MOUTH DAILY WITH BREAKFAST 90  capsule 3   No current facility-administered medications for this visit.     No Known Allergies  Social History   Socioeconomic History  . Marital status: Single    Spouse name: Not on file  . Number of children: Not on file  . Years of education: Not on file  . Highest education level: Not on file  Occupational History  . Not on file  Social Needs  . Financial resource strain: Not hard at all  . Food insecurity    Worry: Never true    Inability: Never true  . Transportation needs    Medical: No    Non-medical: No  Tobacco Use  . Smoking status: Current Every Day Smoker    Packs/day: 0.25    Years: 10.00    Pack years: 2.50    Types: Cigarettes  . Smokeless tobacco: Never Used  Substance and Sexual Activity  . Alcohol use: Yes    Comment: occasionally  . Drug use: No  . Sexual activity: Not Currently  Lifestyle  . Physical activity    Days per week: 3 days    Minutes per session: 30 min  . Stress: Not at all  Relationships  . Social Herbalist on phone: Three times a week    Gets together: Twice a week    Attends religious service: More than 4 times per year    Active member of club or organization: No    Attends meetings of clubs or organizations: Never    Relationship status: Married  . Intimate partner violence    Fear of current or ex  partner: No    Emotionally abused: No    Physically abused: No    Forced sexual activity: No  Other Topics Concern  . Not on file  Social History Narrative  . Not on file    ROS Per hpi  Objective   Vitals as reported by the patient: Today's Vitals   08/07/19 1122  Temp: 98 F (36.7 C)  TempSrc: Oral  Weight: 132 lb (59.9 kg)  Height: 5\' 2"  (1.575 m)    Diagnoses and all orders for this visit:  Attention deficit hyperactivity disorder (ADHD), unspecified ADHD type -     amphetamine-dextroamphetamine (ADDERALL XR) 30 MG 24 hr capsule; Take 1 capsule (30 mg total) by mouth every morning. -      amphetamine-dextroamphetamine (ADDERALL) 20 MG tablet; Take 1 tablet (20 mg total) by mouth 2 (two) times daily.   PLAN  PDMP reviewed. No other controls since last visit with me.  Will send 1 mo (30 day) fill. She will send refill request for next fill  Visit after 3 fills for Med check.   Patient encouraged to call clinic with any questions, comments, or concerns.    I discussed the assessment and treatment plan with the patient. The patient was provided an opportunity to ask questions and all were answered. The patient agreed with the plan and demonstrated an understanding of the instructions.   The patient was advised to call back or seek an in-person evaluation if the symptoms worsen or if the condition fails to improve as anticipated.  I provided 11 minutes of non-face-to-face time during this encounter.  , NP  Primary Care at [facility]

## 2019-09-04 ENCOUNTER — Telehealth: Payer: Self-pay | Admitting: Registered Nurse

## 2019-09-04 NOTE — Telephone Encounter (Signed)
Pt request refill   amphetamine-dextroamphetamine (ADDERALL XR) 30 MG 24 hr capsule   amphetamine-dextroamphetamine (ADDERALL) 20 MG tablet    [facility] #[id number] - [place], [place] - [address] AT [address] [phone] (Phone) [phone] (Fax)

## 2019-09-05 ENCOUNTER — Other Ambulatory Visit: Payer: Self-pay | Admitting: Registered Nurse

## 2019-09-05 DIAGNOSIS — F909 Attention-deficit hyperactivity disorder, unspecified type: Secondary | ICD-10-CM

## 2019-09-05 MED ORDER — AMPHETAMINE-DEXTROAMPHETAMINE 20 MG PO TABS: 20.0000 mg | ORAL_TABLET | Freq: Two times a day (BID) | ORAL | 0 refills | Status: DC

## 2019-09-05 MED ORDER — AMPHETAMINE-DEXTROAMPHET ER 30 MG PO CP24: 30.0000 mg | ORAL_CAPSULE | ORAL | 0 refills | Status: DC

## 2019-10-10 ENCOUNTER — Other Ambulatory Visit: Payer: Self-pay | Admitting: Registered Nurse

## 2019-10-10 DIAGNOSIS — F909 Attention-deficit hyperactivity disorder, unspecified type: Secondary | ICD-10-CM

## 2019-10-10 NOTE — Telephone Encounter (Signed)
Medication Refill - Medication: amphetamine-dextroamphetamine (ADDERALL XR) 30 MG 24 hr capsule amphetamine-dextroamphetamine (ADDERALL) 20 MG tablet   Has the patient contacted their pharmacy? Yes.   (Agent: If no, request that the patient contact the pharmacy for the refill.) (Agent: If yes, when and what did the pharmacy advise?)  Preferred Pharmacy (with phone number or street name):  [facility][id number] [place], [place] - [address] AT [facility] [address] [facility] [address] [facility] [address]  [address] [place] [address]  Phone: [phone] Fax: [phone]     Agent: Please be advised that RX refills may take up to 3 business days. We ask that you follow-up with your pharmacy.

## 2019-10-11 ENCOUNTER — Telehealth: Payer: Self-pay | Admitting: *Deleted

## 2019-10-11 ENCOUNTER — Encounter: Payer: Self-pay | Admitting: Registered Nurse

## 2019-10-11 ENCOUNTER — Other Ambulatory Visit: Payer: Self-pay | Admitting: Registered Nurse

## 2019-10-11 DIAGNOSIS — F909 Attention-deficit hyperactivity disorder, unspecified type: Secondary | ICD-10-CM

## 2019-10-11 MED ORDER — AMPHETAMINE-DEXTROAMPHET ER 30 MG PO CP24: 30.0000 mg | ORAL_CAPSULE | ORAL | 0 refills | Status: DC

## 2019-10-11 MED ORDER — AMPHETAMINE-DEXTROAMPHETAMINE 20 MG PO TABS: 20.0000 mg | ORAL_TABLET | Freq: Two times a day (BID) | ORAL | 0 refills | Status: DC

## 2019-10-11 NOTE — Telephone Encounter (Signed)
Crystal Webb would like her Adderall filled .

## 2019-10-11 NOTE — Telephone Encounter (Signed)
Spoke to the pharmacy patient was able to pick up prescription for medication in December.  PA said it was denied .  Pharmacy stated they were able to get it to go through.Marland Kitchen

## 2019-11-09 ENCOUNTER — Other Ambulatory Visit: Payer: Self-pay | Admitting: Registered Nurse

## 2019-11-09 DIAGNOSIS — F909 Attention-deficit hyperactivity disorder, unspecified type: Secondary | ICD-10-CM

## 2019-11-09 NOTE — Telephone Encounter (Signed)
Please advise last seen 1 week ago

## 2019-11-09 NOTE — Telephone Encounter (Signed)
Medication Refill - Medication: amphetamine-dextroamphetamine (ADDERALL XR) 30 MG 24 hr capsule amphetamine-dextroamphetamine (ADDERALL) 20 MG tablet   Preferred Pharmacy (with phone number or street name):  [facility] #[id number] [place], [place] - [address] AT [facility] [address] [facility] [address] [facility] [address] Phone:  [phone]  Fax:  [phone]       Agent: Please be advised that RX refills may take up to 3 business days. We ask that you follow-up with your pharmacy.

## 2019-11-09 NOTE — Telephone Encounter (Signed)
This is not a patient in our practice. Thanks

## 2019-11-10 MED ORDER — AMPHETAMINE-DEXTROAMPHETAMINE 20 MG PO TABS: 20.0000 mg | ORAL_TABLET | Freq: Two times a day (BID) | ORAL | 0 refills | Status: DC

## 2019-11-10 MED ORDER — AMPHETAMINE-DEXTROAMPHET ER 30 MG PO CP24: 30.0000 mg | ORAL_CAPSULE | ORAL | 0 refills | Status: DC

## 2019-11-10 NOTE — Telephone Encounter (Signed)
Pt needs appointment for further fills. 15 day courtesy given.  Jari Sportsman, NP

## 2019-11-16 ENCOUNTER — Telehealth: Payer: Self-pay | Admitting: Registered Nurse

## 2019-11-16 NOTE — Telephone Encounter (Signed)
Pt is returning Crystal Webb call regarding her request for [facility] refill request

## 2019-11-16 NOTE — Telephone Encounter (Signed)
Please advise on Atterall tab

## 2019-11-16 NOTE — Telephone Encounter (Signed)
Adderall tab

## 2019-11-16 NOTE — Telephone Encounter (Signed)
Copied from CRM [id number]. Topic: General - Other >> Nov 15, 2019  5:18 PM Marylen Ponto wrote: Reason for CRM: Pt stated the Rx for amphetamine-dextroamphetamine (ADDERALL XR) 30 MG 24 hr capsule should have been for 30 capsules not 15. Pt requests call back

## 2019-11-17 ENCOUNTER — Telehealth: Payer: Self-pay | Admitting: Registered Nurse

## 2019-11-17 DIAGNOSIS — F909 Attention-deficit hyperactivity disorder, unspecified type: Secondary | ICD-10-CM

## 2019-11-17 NOTE — Telephone Encounter (Signed)
Was it for #15 in error or was it used as a buffer refill?

## 2019-11-17 NOTE — Telephone Encounter (Signed)
Patient called stating her dosage was incorrect for the following medications please check and send back to pharmacy    amphetamine-dextroamphetamine (ADDERALL XR) 30 MG 24 hr capsule [[id number]]   Pharmacy gave pt 15 for dosage however it does states it should be 30 .  Please advise     [facility] #[id number] [place], [place] - [address] AT [facility] [address] [facility] [address] [facility] [address]  [address] [place] [address]  Phone: [phone] Fax: [phone]

## 2019-11-17 NOTE — Telephone Encounter (Signed)
Pt needs appt for further refills  Jari Sportsman, NP

## 2019-11-17 NOTE — Telephone Encounter (Signed)
Noted. Attempted to call pt

## 2019-11-17 NOTE — Telephone Encounter (Signed)
Courtesy fill until patient can be seen for appointment  Jari Sportsman, NP

## 2019-11-24 ENCOUNTER — Telehealth (INDEPENDENT_AMBULATORY_CARE_PROVIDER_SITE_OTHER): Payer: Medicaid Other | Admitting: Registered Nurse

## 2019-11-24 ENCOUNTER — Other Ambulatory Visit: Payer: Self-pay

## 2019-11-24 DIAGNOSIS — F909 Attention-deficit hyperactivity disorder, unspecified type: Secondary | ICD-10-CM

## 2019-11-24 MED ORDER — AMPHETAMINE-DEXTROAMPHETAMINE 20 MG PO TABS: 20.0000 mg | ORAL_TABLET | Freq: Two times a day (BID) | ORAL | 0 refills | Status: DC

## 2019-11-24 MED ORDER — AMPHETAMINE-DEXTROAMPHET ER 30 MG PO CP24: 30.0000 mg | ORAL_CAPSULE | ORAL | 0 refills | Status: DC

## 2019-11-24 NOTE — Progress Notes (Signed)
Telemedicine Encounter- SOAP NOTE Established Patient  This telephone encounter was conducted with the patient's (or proxy's) verbal consent via audio telecommunications: yes  Patient was instructed to have this encounter in a suitably private space; and to only have persons present to whom they give permission to participate. In addition, patient identity was confirmed by use of name plus two identifiers (DOB and address).  I discussed the limitations, risks, security and privacy concerns of performing an evaluation and management service by telephone and the availability of in person appointments. I also discussed with the patient that there may be a patient responsible charge related to this service. The patient expressed understanding and agreed to proceed.  I spent a total of 7 minutes talking with the patient or their proxy.  No chief complaint on file.   Subjective   Crystal Webb is a 44 y.o. established patient. Telephone visit today for medication refills  HPI Requesting refills on Adderall.  Takes 30mg  XR, 20mg , 20mg  daily. Has been on this dosage for some time without complaint. Denies any AEs today - no sleep disturbance, palpitations, headaches, or other notable changes. Has been on effexor for some time as well, feels this works well.   Overall no complaints.   Patient Active Problem List   Diagnosis Date Noted  . Anxiety state 01/17/2019  . Attention deficit hyperactivity disorder (ADHD) 01/17/2019    Past Medical History:  Diagnosis Date  . ADD (attention deficit disorder)     Current Outpatient Medications  Medication Sig Dispense Refill  . amphetamine-dextroamphetamine (ADDERALL XR) 30 MG 24 hr capsule Take 1 capsule (30 mg total) by mouth every morning. 30 capsule 0  . amphetamine-dextroamphetamine (ADDERALL) 20 MG tablet Take 1 tablet (20 mg total) by mouth 2 (two) times daily. 60 tablet 0  . venlafaxine XR (EFFEXOR-XR) 150 MG 24 hr capsule TAKE 1  CAPSULE BY MOUTH DAILY WITH BREAKFAST 90 capsule 3  . [START ON 12/22/2019] amphetamine-dextroamphetamine (ADDERALL XR) 30 MG 24 hr capsule Take 1 capsule (30 mg total) by mouth every morning. 30 capsule 0  . [START ON 01/19/2020] amphetamine-dextroamphetamine (ADDERALL XR) 30 MG 24 hr capsule Take 1 capsule (30 mg total) by mouth every morning. 30 capsule 0  . [START ON 12/22/2019] amphetamine-dextroamphetamine (ADDERALL) 20 MG tablet Take 1 tablet (20 mg total) by mouth 2 (two) times daily. 60 tablet 0  . [START ON 01/19/2020] amphetamine-dextroamphetamine (ADDERALL) 20 MG tablet Take 1 tablet (20 mg total) by mouth 2 (two) times daily. 60 tablet 0   No current facility-administered medications for this visit.    No Known Allergies  Social History   Socioeconomic History  . Marital status: Single    Spouse name: Not on file  . Number of children: Not on file  . Years of education: Not on file  . Highest education level: Not on file  Occupational History  . Not on file  Tobacco Use  . Smoking status: Current Every Day Smoker    Packs/day: 0.25    Years: 10.00    Pack years: 2.50    Types: Cigarettes  . Smokeless tobacco: Never Used  Substance and Sexual Activity  . Alcohol use: Yes    Comment: occasionally  . Drug use: No  . Sexual activity: Not Currently  Other Topics Concern  . Not on file  Social History Narrative  . Not on file   Social Determinants of Health   Financial Resource Strain: Low Risk   . Difficulty  of Paying Living Expenses: Not hard at all  Food Insecurity: No Food Insecurity  . Worried About Charity fundraiser in the Last Year: Never true  . Ran Out of Food in the Last Year: Never true  Transportation Needs: No Transportation Needs  . Lack of Transportation (Medical): No  . Lack of Transportation (Non-Medical): No  Physical Activity: Insufficiently Active  . Days of Exercise per Week: 3 days  . Minutes of Exercise per Session: 30 min  Stress: No  Stress Concern Present  . Feeling of Stress : Not at all  Social Connections: Slightly Isolated  . Frequency of Communication with Friends and Family: Three times a week  . Frequency of Social Gatherings with Friends and Family: Twice a week  . Attends Religious Services: More than 4 times per year  . Active Member of Clubs or Organizations: No  . Attends Archivist Meetings: Never  . Marital Status: Married  Human resources officer Violence: Not At Risk  . Fear of Current or Ex-Partner: No  . Emotionally Abused: No  . Physically Abused: No  . Sexually Abused: No    ROS Negative across 12 point ROS  Objective   Vitals as reported by the patient: There were no vitals filed for this visit.  Diagnoses and all orders for this visit:  Attention deficit hyperactivity disorder (ADHD), unspecified ADHD type -     amphetamine-dextroamphetamine (ADDERALL XR) 30 MG 24 hr capsule; Take 1 capsule (30 mg total) by mouth every morning. -     amphetamine-dextroamphetamine (ADDERALL) 20 MG tablet; Take 1 tablet (20 mg total) by mouth 2 (two) times daily. -     amphetamine-dextroamphetamine (ADDERALL XR) 30 MG 24 hr capsule; Take 1 capsule (30 mg total) by mouth every morning. -     amphetamine-dextroamphetamine (ADDERALL) 20 MG tablet; Take 1 tablet (20 mg total) by mouth 2 (two) times daily. -     amphetamine-dextroamphetamine (ADDERALL XR) 30 MG 24 hr capsule; Take 1 capsule (30 mg total) by mouth every morning. -     amphetamine-dextroamphetamine (ADDERALL) 20 MG tablet; Take 1 tablet (20 mg total) by mouth 2 (two) times daily.   PLAN  3 mos of refills sent dated appropriately for refills  Return in 3 mos for med check and refills  Sooner with any concerns  CPE and labs at some point in 2021.  Patient encouraged to call clinic with any questions, comments, or concerns.  I discussed the assessment and treatment plan with the patient. The patient was provided an opportunity to ask  questions and all were answered. The patient agreed with the plan and demonstrated an understanding of the instructions.   The patient was advised to call back or seek an in-person evaluation if the symptoms worsen or if the condition fails to improve as anticipated.  I provided 7 minutes of non-face-to-face time during this encounter.  Maximiano Coss, NP  Primary Care at [facility]

## 2019-11-24 NOTE — Progress Notes (Signed)
Patient would like to talk to NP about medication management. She need a refill of Adderall 30 MG at this time and have no other concerns.

## 2020-02-12 ENCOUNTER — Other Ambulatory Visit: Payer: Self-pay

## 2020-02-12 ENCOUNTER — Telehealth (INDEPENDENT_AMBULATORY_CARE_PROVIDER_SITE_OTHER): Payer: Medicaid Other | Admitting: Registered Nurse

## 2020-02-12 DIAGNOSIS — F909 Attention-deficit hyperactivity disorder, unspecified type: Secondary | ICD-10-CM

## 2020-02-12 MED ORDER — AMPHETAMINE-DEXTROAMPHETAMINE 20 MG PO TABS: 20.0000 mg | ORAL_TABLET | Freq: Two times a day (BID) | ORAL | 0 refills | Status: DC

## 2020-02-12 MED ORDER — AMPHETAMINE-DEXTROAMPHET ER 30 MG PO CP24: 30.0000 mg | ORAL_CAPSULE | ORAL | 0 refills | Status: DC

## 2020-02-12 NOTE — Progress Notes (Signed)
Telemedicine Encounter- SOAP NOTE Established Patient  This telephone encounter was conducted with the patient's (or proxy's) verbal consent via audio telecommunications: yes  Patient was instructed to have this encounter in a suitably private space; and to only have persons present to whom they give permission to participate. In addition, patient identity was confirmed by use of name plus two identifiers (DOB and address).  I discussed the limitations, risks, security and privacy concerns of performing an evaluation and management service by telephone and the availability of in person appointments. I also discussed with the patient that there may be a patient responsible charge related to this service. The patient expressed understanding and agreed to proceed.  I spent a total of 11 minutes talking with the patient or their proxy.  Chief Complaint  Patient presents with  . Medication Refill    Subjective   Crystal Webb is a 44 y.o. established patient. Telephone visit today for med refill  HPI Med refill on adderall. Has been on 30ER+20ER+20ER daily for years - good effect. Denies AEs. Does not wish to change dosage at this time.  No further concerns.  Encouraged CPE and labs.  Patient Active Problem List   Diagnosis Date Noted  . Anxiety state 01/17/2019  . Attention deficit hyperactivity disorder (ADHD) 01/17/2019    Past Medical History:  Diagnosis Date  . ADD (attention deficit disorder)     Current Outpatient Medications  Medication Sig Dispense Refill  . amphetamine-dextroamphetamine (ADDERALL XR) 30 MG 24 hr capsule Take 1 capsule (30 mg total) by mouth every morning. 30 capsule 0  . [START ON 03/11/2020] amphetamine-dextroamphetamine (ADDERALL XR) 30 MG 24 hr capsule Take 1 capsule (30 mg total) by mouth every morning. 30 capsule 0  . [START ON 04/08/2020] amphetamine-dextroamphetamine (ADDERALL XR) 30 MG 24 hr capsule Take 1 capsule (30 mg total) by mouth every  morning. 30 capsule 0  . amphetamine-dextroamphetamine (ADDERALL) 20 MG tablet Take 1 tablet (20 mg total) by mouth 2 (two) times daily. 60 tablet 0  . [START ON 03/11/2020] amphetamine-dextroamphetamine (ADDERALL) 20 MG tablet Take 1 tablet (20 mg total) by mouth 2 (two) times daily. 60 tablet 0  . [START ON 04/08/2020] amphetamine-dextroamphetamine (ADDERALL) 20 MG tablet Take 1 tablet (20 mg total) by mouth 2 (two) times daily. 60 tablet 0  . venlafaxine XR (EFFEXOR-XR) 150 MG 24 hr capsule TAKE 1 CAPSULE BY MOUTH DAILY WITH BREAKFAST 90 capsule 3   No current facility-administered medications for this visit.    No Known Allergies  Social History   Socioeconomic History  . Marital status: Single    Spouse name: Not on file  . Number of children: Not on file  . Years of education: Not on file  . Highest education level: Not on file  Occupational History  . Not on file  Tobacco Use  . Smoking status: Current Every Day Smoker    Packs/day: 0.25    Years: 10.00    Pack years: 2.50    Types: Cigarettes  . Smokeless tobacco: Never Used  Substance and Sexual Activity  . Alcohol use: Yes    Comment: occasionally  . Drug use: No  . Sexual activity: Not Currently  Other Topics Concern  . Not on file  Social History Narrative  . Not on file   Social Determinants of Health   Financial Resource Strain: Low Risk   . Difficulty of Paying Living Expenses: Not hard at all  Food Insecurity: No Food Insecurity  .  Worried About Charity fundraiser in the Last Year: Never true  . Ran Out of Food in the Last Year: Never true  Transportation Needs: No Transportation Needs  . Lack of Transportation (Medical): No  . Lack of Transportation (Non-Medical): No  Physical Activity: Insufficiently Active  . Days of Exercise per Week: 3 days  . Minutes of Exercise per Session: 30 min  Stress: No Stress Concern Present  . Feeling of Stress : Not at all  Social Connections: Slightly Isolated  .  Frequency of Communication with Friends and Family: Three times a week  . Frequency of Social Gatherings with Friends and Family: Twice a week  . Attends Religious Services: More than 4 times per year  . Active Member of Clubs or Organizations: No  . Attends Archivist Meetings: Never  . Marital Status: Married  Human resources officer Violence: Not At Risk  . Fear of Current or Ex-Partner: No  . Emotionally Abused: No  . Physically Abused: No  . Sexually Abused: No    Review of Systems  Constitutional: Negative.   HENT: Negative.   Eyes: Negative.   Respiratory: Negative.   Cardiovascular: Negative.   Gastrointestinal: Negative.   Genitourinary: Negative.   Musculoskeletal: Negative.   Skin: Negative.   Neurological: Negative.   Endo/Heme/Allergies: Negative.   Psychiatric/Behavioral: Negative.   All other systems reviewed and are negative.   Objective   Vitals as reported by the patient: Today's Vitals   02/12/20 1115  Weight: 140 lb (63.5 kg)  Height: 5\' 1"  (1.549 m)    Crystal Webb was seen today for medication refill.  Diagnoses and all orders for this visit:  Attention deficit hyperactivity disorder (ADHD), unspecified ADHD type -     amphetamine-dextroamphetamine (ADDERALL XR) 30 MG 24 hr capsule; Take 1 capsule (30 mg total) by mouth every morning. -     amphetamine-dextroamphetamine (ADDERALL) 20 MG tablet; Take 1 tablet (20 mg total) by mouth 2 (two) times daily. -     amphetamine-dextroamphetamine (ADDERALL XR) 30 MG 24 hr capsule; Take 1 capsule (30 mg total) by mouth every morning. -     amphetamine-dextroamphetamine (ADDERALL XR) 30 MG 24 hr capsule; Take 1 capsule (30 mg total) by mouth every morning. -     amphetamine-dextroamphetamine (ADDERALL) 20 MG tablet; Take 1 tablet (20 mg total) by mouth 2 (two) times daily. -     amphetamine-dextroamphetamine (ADDERALL) 20 MG tablet; Take 1 tablet (20 mg total) by mouth 2 (two) times daily.   PLAN  Refill x  3 mo  Encourage pt to present for CPE and labs  Return precautions given  pdmp consulted  Patient encouraged to call clinic with any questions, comments, or concerns.   I discussed the assessment and treatment plan with the patient. The patient was provided an opportunity to ask questions and all were answered. The patient agreed with the plan and demonstrated an understanding of the instructions.   The patient was advised to call back or seek an in-person evaluation if the symptoms worsen or if the condition fails to improve as anticipated.  I provided 11 minutes of non-face-to-face time during this encounter.  Maximiano Coss, NP  Primary Care at [facility]

## 2020-02-12 NOTE — Patient Instructions (Signed)
° ° ° °  If you have lab work done today you will be contacted with your lab results within the next 2 weeks.  If you have not heard from us then please contact us. The fastest way to get your results is to register for My Chart. ° ° °IF you received an x-ray today, you will receive an invoice from [facility]. Please contact [facility] at [phone] with questions or concerns regarding your invoice.  ° °IF you received labwork today, you will receive an invoice from [facility]. Please contact [facility] at [phone] with questions or concerns regarding your invoice.  ° °Our billing staff will not be able to assist you with questions regarding bills from these companies. ° °You will be contacted with the lab results as soon as they are available. The fastest way to get your results is to activate your My Chart account. Instructions are located on the last page of this paperwork. If you have not heard from us regarding the results in 2 weeks, please contact this office. °  ° ° ° °

## 2020-05-06 ENCOUNTER — Other Ambulatory Visit: Payer: Self-pay | Admitting: Registered Nurse

## 2020-05-06 DIAGNOSIS — F411 Generalized anxiety disorder: Secondary | ICD-10-CM

## 2020-05-06 NOTE — Telephone Encounter (Signed)
Patient is requesting a refill of the following medications: Requested Prescriptions   Pending Prescriptions Disp Refills  . venlafaxine XR (EFFEXOR-XR) 150 MG 24 hr capsule [Pharmacy Med Name: VENLAFAXINE 150MG  ER CAPSULES] 90 capsule 3    Sig: TAKE 1 CAPSULE BY MOUTH DAILY WITH BREAKFAST    Date of patient request: 05/06/2020 Last office visit: 02/12/2020 Date of last refill: 03/29/2019 Last refill amount: 90 capsule 3 refills  Follow up time period per chart: 05/08/2020

## 2020-05-06 NOTE — Telephone Encounter (Signed)
Pt calling in to follow up on this refill request

## 2020-05-08 ENCOUNTER — Encounter: Payer: Self-pay | Admitting: Registered Nurse

## 2020-05-08 ENCOUNTER — Telehealth (INDEPENDENT_AMBULATORY_CARE_PROVIDER_SITE_OTHER): Payer: Medicaid Other | Admitting: Registered Nurse

## 2020-05-08 ENCOUNTER — Other Ambulatory Visit: Payer: Self-pay

## 2020-05-08 DIAGNOSIS — F909 Attention-deficit hyperactivity disorder, unspecified type: Secondary | ICD-10-CM | POA: Diagnosis not present

## 2020-05-08 MED ORDER — AMPHETAMINE-DEXTROAMPHETAMINE 20 MG PO TABS: 20.0000 mg | ORAL_TABLET | Freq: Two times a day (BID) | ORAL | 0 refills | Status: DC

## 2020-05-08 MED ORDER — AMPHETAMINE-DEXTROAMPHET ER 30 MG PO CP24: 30.0000 mg | ORAL_CAPSULE | ORAL | 0 refills | Status: DC

## 2020-05-08 NOTE — Patient Instructions (Signed)
° ° ° °  If you have lab work done today you will be contacted with your lab results within the next 2 weeks.  If you have not heard from us then please contact us. The fastest way to get your results is to register for My Chart. ° ° °IF you received an x-ray today, you will receive an invoice from [facility]. Please contact [facility] at [phone] with questions or concerns regarding your invoice.  ° °IF you received labwork today, you will receive an invoice from [facility]. Please contact [facility] at [phone] with questions or concerns regarding your invoice.  ° °Our billing staff will not be able to assist you with questions regarding bills from these companies. ° °You will be contacted with the lab results as soon as they are available. The fastest way to get your results is to activate your My Chart account. Instructions are located on the last page of this paperwork. If you have not heard from us regarding the results in 2 weeks, please contact this office. °  ° ° ° °

## 2020-05-08 NOTE — Progress Notes (Signed)
Telemedicine Encounter- SOAP NOTE Established Patient  This telephone encounter was conducted with the patient's (or proxy's) verbal consent via audio telecommunications: yes  Patient was instructed to have this encounter in a suitably private space; and to only have persons present to whom they give permission to participate. In addition, patient identity was confirmed by use of name plus two identifiers (DOB and address).  I discussed the limitations, risks, security and privacy concerns of performing an evaluation and management service by telephone and the availability of in person appointments. I also discussed with the patient that there may be a patient responsible charge related to this service. The patient expressed understanding and agreed to proceed.  I spent a total of 11 minutes talking with the patient or their proxy.  Pt at home Provider in office  Chief Complaint  Patient presents with  . Medication Management    patient states she is follow up for a medication recheck and refill on Adderrall. Per patient she doesn't have any question or concerns at this time.    Subjective   Crystal Webb is a 44 y.o. established patient. Telephone visit today for med refill  HPI 3 mo recheck on adderall Taking as instructed No AEs, no concerns with dosage Continues effexor 150mg  XR as well - anxiety well controlled  No other complaints  PDMP reviewed  Patient Active Problem List   Diagnosis Date Noted  . Anxiety state 01/17/2019  . Attention deficit hyperactivity disorder (ADHD) 01/17/2019    Past Medical History:  Diagnosis Date  . ADD (attention deficit disorder)     Current Outpatient Medications  Medication Sig Dispense Refill  . amphetamine-dextroamphetamine (ADDERALL XR) 30 MG 24 hr capsule Take 1 capsule (30 mg total) by mouth every morning. 30 capsule 0  . [START ON 07/03/2020] amphetamine-dextroamphetamine (ADDERALL XR) 30 MG 24 hr capsule Take 1 capsule  (30 mg total) by mouth every morning. 30 capsule 0  . [START ON 06/05/2020] amphetamine-dextroamphetamine (ADDERALL XR) 30 MG 24 hr capsule Take 1 capsule (30 mg total) by mouth every morning. 30 capsule 0  . amphetamine-dextroamphetamine (ADDERALL) 20 MG tablet Take 1 tablet (20 mg total) by mouth 2 (two) times daily. 60 tablet 0  . [START ON 07/03/2020] amphetamine-dextroamphetamine (ADDERALL) 20 MG tablet Take 1 tablet (20 mg total) by mouth 2 (two) times daily. 60 tablet 0  . [START ON 06/05/2020] amphetamine-dextroamphetamine (ADDERALL) 20 MG tablet Take 1 tablet (20 mg total) by mouth 2 (two) times daily. 60 tablet 0  . venlafaxine XR (EFFEXOR-XR) 150 MG 24 hr capsule TAKE 1 CAPSULE BY MOUTH DAILY WITH BREAKFAST 90 capsule 3  . [START ON 07/31/2020] amphetamine-dextroamphetamine (ADDERALL XR) 30 MG 24 hr capsule Take 1 capsule (30 mg total) by mouth every morning. 30 capsule 0  . [START ON 07/31/2020] amphetamine-dextroamphetamine (ADDERALL) 20 MG tablet Take 1 tablet (20 mg total) by mouth 2 (two) times daily. 60 tablet 0   No current facility-administered medications for this visit.    No Known Allergies  Social History   Socioeconomic History  . Marital status: Single    Spouse name: Not on file  . Number of children: Not on file  . Years of education: Not on file  . Highest education level: Not on file  Occupational History  . Not on file  Tobacco Use  . Smoking status: Current Every Day Smoker    Packs/day: 0.25    Years: 10.00    Pack years: 2.50  Types: Cigarettes  . Smokeless tobacco: Never Used  Vaping Use  . Vaping Use: Never used  Substance and Sexual Activity  . Alcohol use: Yes    Comment: occasionally  . Drug use: No  . Sexual activity: Not Currently  Other Topics Concern  . Not on file  Social History Narrative  . Not on file   Social Determinants of Health   Financial Resource Strain:   . Difficulty of Paying Living Expenses:   Food Insecurity:   .  Worried About Programme researcher, broadcasting/film/video in the Last Year:   . Barista in the Last Year:   Transportation Needs:   . Freight forwarder (Medical):   Marland Kitchen Lack of Transportation (Non-Medical):   Physical Activity:   . Days of Exercise per Week:   . Minutes of Exercise per Session:   Stress:   . Feeling of Stress :   Social Connections:   . Frequency of Communication with Friends and Family:   . Frequency of Social Gatherings with Friends and Family:   . Attends Religious Services:   . Active Member of Clubs or Organizations:   . Attends Banker Meetings:   Marland Kitchen Marital Status:   Intimate Partner Violence:   . Fear of Current or Ex-Partner:   . Emotionally Abused:   Marland Kitchen Physically Abused:   . Sexually Abused:     ROS Per hpi, otherwise negative  Objective   Vitals as reported by the patient: There were no vitals filed for this visit.  Crystal Webb was seen today for medication management.  Diagnoses and all orders for this visit:  Attention deficit hyperactivity disorder (ADHD), unspecified ADHD type -     amphetamine-dextroamphetamine (ADDERALL XR) 30 MG 24 hr capsule; Take 1 capsule (30 mg total) by mouth every morning. -     amphetamine-dextroamphetamine (ADDERALL XR) 30 MG 24 hr capsule; Take 1 capsule (30 mg total) by mouth every morning. -     amphetamine-dextroamphetamine (ADDERALL XR) 30 MG 24 hr capsule; Take 1 capsule (30 mg total) by mouth every morning. -     amphetamine-dextroamphetamine (ADDERALL) 20 MG tablet; Take 1 tablet (20 mg total) by mouth 2 (two) times daily. -     amphetamine-dextroamphetamine (ADDERALL) 20 MG tablet; Take 1 tablet (20 mg total) by mouth 2 (two) times daily. -     amphetamine-dextroamphetamine (ADDERALL) 20 MG tablet; Take 1 tablet (20 mg total) by mouth 2 (two) times daily. -     amphetamine-dextroamphetamine (ADDERALL) 20 MG tablet; Take 1 tablet (20 mg total) by mouth 2 (two) times daily. -     amphetamine-dextroamphetamine  (ADDERALL XR) 30 MG 24 hr capsule; Take 1 capsule (30 mg total) by mouth every morning.   PLAN  Given her consistency with fills, will extend to 4 mo follow ups.  PDMP consulted, no concerns  Patient encouraged to call clinic with any questions, comments, or concerns.  I discussed the assessment and treatment plan with the patient. The patient was provided an opportunity to ask questions and all were answered. The patient agreed with the plan and demonstrated an understanding of the instructions.   The patient was advised to call back or seek an in-person evaluation if the symptoms worsen or if the condition fails to improve as anticipated.  I provided 11 minutes of non-face-to-face time during this encounter.  [name], NP  Primary Care at [facility]

## 2020-09-04 ENCOUNTER — Other Ambulatory Visit: Payer: Self-pay | Admitting: Registered Nurse

## 2020-09-04 DIAGNOSIS — F411 Generalized anxiety disorder: Secondary | ICD-10-CM

## 2020-09-04 MED ORDER — VENLAFAXINE HCL ER 150 MG PO CP24: ORAL_CAPSULE | ORAL | 0 refills | Status: DC

## 2020-09-04 NOTE — Telephone Encounter (Signed)
PT need a refill  enlafaxine XR (EFFEXOR-XR) 150 MG 24 hr capsule [[id number]]  [facility] #[id number] [place], [place] - [address] AT [facility] [address] [facility] [address] [facility] [address]  [address] [place] [address]  Phone: [phone] Fax: [phone]

## 2020-09-04 NOTE — Addendum Note (Signed)
Addended by: Stormy Card L on: 09/04/2020 01:00 PM   Modules accepted: Orders

## 2020-09-10 ENCOUNTER — Other Ambulatory Visit: Payer: Self-pay

## 2020-09-10 ENCOUNTER — Encounter: Payer: Self-pay | Admitting: Registered Nurse

## 2020-09-10 ENCOUNTER — Telehealth (INDEPENDENT_AMBULATORY_CARE_PROVIDER_SITE_OTHER): Payer: Medicaid Other | Admitting: Registered Nurse

## 2020-09-10 DIAGNOSIS — F909 Attention-deficit hyperactivity disorder, unspecified type: Secondary | ICD-10-CM | POA: Diagnosis not present

## 2020-09-10 MED ORDER — AMPHETAMINE-DEXTROAMPHET ER 30 MG PO CP24: 30.0000 mg | ORAL_CAPSULE | ORAL | 0 refills | Status: DC

## 2020-09-10 MED ORDER — AMPHETAMINE-DEXTROAMPHETAMINE 15 MG PO TABS: 15.0000 mg | ORAL_TABLET | Freq: Two times a day (BID) | ORAL | 0 refills | Status: DC

## 2020-09-10 NOTE — Patient Instructions (Signed)
° ° ° °  If you have lab work done today you will be contacted with your lab results within the next 2 weeks.  If you have not heard from us then please contact us. The fastest way to get your results is to register for My Chart. ° ° °IF you received an x-ray today, you will receive an invoice from [facility]. Please contact [facility] at [phone] with questions or concerns regarding your invoice.  ° °IF you received labwork today, you will receive an invoice from [facility]. Please contact [facility] at [phone] with questions or concerns regarding your invoice.  ° °Our billing staff will not be able to assist you with questions regarding bills from these companies. ° °You will be contacted with the lab results as soon as they are available. The fastest way to get your results is to activate your My Chart account. Instructions are located on the last page of this paperwork. If you have not heard from us regarding the results in 2 weeks, please contact this office. °  ° ° ° °

## 2020-09-10 NOTE — Progress Notes (Signed)
Telemedicine Encounter- SOAP NOTE Established Patient  This telephone encounter was conducted with the patient's (or proxy's) verbal consent via audio telecommunications: yes  Patient was instructed to have this encounter in a suitably private space; and to only have persons present to whom they give permission to participate. In addition, patient identity was confirmed by use of name plus two identifiers (DOB and address).  I discussed the limitations, risks, security and privacy concerns of performing an evaluation and management service by telephone and the availability of in person appointments. I also discussed with the patient that there may be a patient responsible charge related to this service. The patient expressed understanding and agreed to proceed.  I spent a total of 22 minutes talking with the patient or their proxy.  Patient at home Provider in office  Chief Complaint  Patient presents with  . Medication Management    Patient states she is having a follow up for medication management on adderrall. per patient also nedds an medication refill    Subjective   Crystal Webb is a 44 y.o. established patient. Telephone visit today for med management  HPI Has been on adderall for some time - had 30mg  ER PO every morning with 20mg  IR PO early afternoon - however, has been working a job heavy in data entry and finding that over the 12 hour days, the effect fades by the evening. She is hesitant to change doses as she has had sleep disturbance in the past. Does not have interest in ER formula during day - only in morning. Thinks that adding an additional 20mg  may be too much  PDMP consulted - no concerns  Patient Active Problem List   Diagnosis Date Noted  . Anxiety state 01/17/2019  . Attention deficit hyperactivity disorder (ADHD) 01/17/2019    Past Medical History:  Diagnosis Date  . ADD (attention deficit disorder)     Current Outpatient Medications  Medication  Sig Dispense Refill  . amphetamine-dextroamphetamine (ADDERALL XR) 30 MG 24 hr capsule Take 1 capsule (30 mg total) by mouth every morning. 30 capsule 0  . amphetamine-dextroamphetamine (ADDERALL XR) 30 MG 24 hr capsule Take 1 capsule (30 mg total) by mouth every morning. 30 capsule 0  . amphetamine-dextroamphetamine (ADDERALL) 15 MG tablet Take 1 tablet by mouth 2 (two) times daily. 60 tablet 0  . venlafaxine XR (EFFEXOR-XR) 150 MG 24 hr capsule TAKE 1 CAPSULE BY MOUTH DAILY WITH BREAKFAST (Patient not taking: Reported on 09/10/2020) 90 capsule 0   No current facility-administered medications for this visit.    No Known Allergies  Social History   Socioeconomic History  . Marital status: Single    Spouse name: Not on file  . Number of children: Not on file  . Years of education: Not on file  . Highest education level: Not on file  Occupational History  . Not on file  Tobacco Use  . Smoking status: Current Every Day Smoker    Packs/day: 0.25    Years: 10.00    Pack years: 2.50    Types: Cigarettes  . Smokeless tobacco: Never Used  Vaping Use  . Vaping Use: Never used  Substance and Sexual Activity  . Alcohol use: Yes    Comment: occasionally  . Drug use: No  . Sexual activity: Not Currently  Other Topics Concern  . Not on file  Social History Narrative  . Not on file   Social Determinants of Health   Financial Resource Strain:   .  Difficulty of Paying Living Expenses: Not on file  Food Insecurity:   . Worried About Programme researcher, broadcasting/film/video in the Last Year: Not on file  . Ran Out of Food in the Last Year: Not on file  Transportation Needs:   . Lack of Transportation (Medical): Not on file  . Lack of Transportation (Non-Medical): Not on file  Physical Activity:   . Days of Exercise per Week: Not on file  . Minutes of Exercise per Session: Not on file  Stress:   . Feeling of Stress : Not on file  Social Connections:   . Frequency of Communication with Friends and  Family: Not on file  . Frequency of Social Gatherings with Friends and Family: Not on file  . Attends Religious Services: Not on file  . Active Member of Clubs or Organizations: Not on file  . Attends Banker Meetings: Not on file  . Marital Status: Not on file  Intimate Partner Violence:   . Fear of Current or Ex-Partner: Not on file  . Emotionally Abused: Not on file  . Physically Abused: Not on file  . Sexually Abused: Not on file    Review of Systems  Constitutional: Negative.   HENT: Negative.   Eyes: Negative.   Respiratory: Negative.   Cardiovascular: Negative.   Gastrointestinal: Negative.   Genitourinary: Negative.   Musculoskeletal: Negative.   Skin: Negative.   Neurological: Negative.   Endo/Heme/Allergies: Negative.   Psychiatric/Behavioral: Negative.     Objective   Vitals as reported by the patient: There were no vitals filed for this visit.  [name] was seen today for medication management.  Diagnoses and all orders for this visit:  Attention deficit hyperactivity disorder (ADHD), unspecified ADHD type -     amphetamine-dextroamphetamine (ADDERALL XR) 30 MG 24 hr capsule; Take 1 capsule (30 mg total) by mouth every morning. -     amphetamine-dextroamphetamine (ADDERALL) 15 MG tablet; Take 1 tablet by mouth 2 (two) times daily.   PLAN  Alter dosing: 30mg  ER, 15mg  IR, 15mg  IR PO qd.   Can split tablets in afternoon if desired.  Monitor effect - will give 1 mo supply and adjust as needed  Next visit required will be in 6 mo as long as PDMP remains so consistent.  Patient encouraged to call clinic with any questions, comments, or concerns.   I discussed the assessment and treatment plan with the patient. The patient was provided an opportunity to ask questions and all were answered. The patient agreed with the plan and demonstrated an understanding of the instructions.   The patient was advised to call back or seek an in-person  evaluation if the symptoms worsen or if the condition fails to improve as anticipated.  I provided 22 minutes of non-face-to-face time during this encounter.  , NP  Primary Care at [facility]

## 2020-09-11 ENCOUNTER — Telehealth: Payer: Self-pay | Admitting: Registered Nurse

## 2020-09-11 NOTE — Telephone Encounter (Signed)
PATIENT HAD A TELEMED APPOINTMENT WITH RICH MORROW ON Tuesday (09/10/20). SHE STATES THERE IS SOME CONFUSION WITH HER MEDICINE. SHE WAS TAKING ADDERALL 20 mg ONE PILL IN THE MORNING AND 1 PILL IN THE AFTERNOON. WHEN SHE WENT TO THE PHARMACY RICH HAD PRESCRIBED ADDERALL 15 mg. SHE THOUGHT HE WAS GOING TO INCREASE THE DOSAGE NOT THE MG? BEST PHONE [phone] (CELL) MBC

## 2020-09-12 ENCOUNTER — Encounter: Payer: Self-pay | Admitting: Registered Nurse

## 2020-09-12 NOTE — Telephone Encounter (Signed)
Rich not sure what to tell this patient I do not see in the notes if you discussed to increase or decrease this medication please advise

## 2020-09-12 NOTE — Telephone Encounter (Signed)
I have contacted patient and am working on this  Thank you  [employer]

## 2020-09-13 ENCOUNTER — Other Ambulatory Visit: Payer: Self-pay | Admitting: Registered Nurse

## 2020-09-13 NOTE — Telephone Encounter (Signed)
Read pt note for plan for adderall  Pt is 30 pills short for  amphetamine-dextroamphetamine (ADDERALL) 15   because she has been taking 30ER and 15IR/ then 15IR and another 15 IR , taking 3 doses of 15IR one 1st thing in am with the 30ER   Please advise

## 2020-09-16 MED ORDER — AMPHETAMINE-DEXTROAMPHETAMINE 10 MG PO TABS: 10.0000 mg | ORAL_TABLET | Freq: Every evening | ORAL | 0 refills | Status: DC

## 2020-09-16 NOTE — Telephone Encounter (Signed)
I have called pt to inform her that the rx has been sent to pharmacy and to give [place] a call back if there is any additional questions. There was no answer so I have left a voicemail for the pt.

## 2020-09-16 NOTE — Telephone Encounter (Signed)
Called pharmacy and they stated that they do not have the 10 mg of the Adderall and it may need to be resent. I have pended the rx.

## 2020-09-16 NOTE — Telephone Encounter (Signed)
Pt calling back and stated she is wanting this medication sent to  [facility] #[id number] - [place], [place] - [address] AT [facility] OF [address]  Pt stated pharmacy did not have medication when she called. Please advise.

## 2020-09-16 NOTE — Telephone Encounter (Signed)
I have called pt to clarify what has been going on with the Adderall RX.  Pt was originally on 30 mg XR along with 20 mg with am does and then 20 mg lunch time TOTALING 70 mg Daily (30 mg XR in the AM and 20 mg BID)  She stated that she thought that the Plan was to do 30 mg XR along with a 15 mg Am and then 15 mg at Lunch and another 15 mg at [employer] TOTALING 85 mg daily   (30 mg XR in the Am with 15 mg TID)  Since she already picked up a 15 mg # 60 it is only good for BID. Pt is wondering if she can have an additional 10 mg nightly so insurance will pay for it. With the added 10 mg nightly the daily the TOTALING 80 mg daily (30 mg XR in the AM with 15 mg BID and 10 mg Nightly)   I have informed pt that I will send the provider a message to see if that plan will work and she stated understanding. Rx has been pended for provider approval.

## 2020-09-16 NOTE — Addendum Note (Signed)
Addended by: Lisbeth Renshaw, Nidya Bouyer HUA on: 09/16/2020 02:11 PM   Modules accepted: Orders

## 2020-10-17 ENCOUNTER — Telehealth: Payer: Self-pay | Admitting: Registered Nurse

## 2020-10-17 NOTE — Telephone Encounter (Signed)
Pt rx got messed up a few months ago and Crystal Webb had done what he could to fix it at the time.  Pt is now due for refill today and is asking we fix Rx.   She should have: Adderall 30 mg XR 1 tablet daily in the morning Adderall 20 mg IR 1 tablet three times daily.  Pt was also supposed to get a 6 month supply but if you could just send a little until [name] is back in the office so he can send the rest it would be appreciated.

## 2020-10-17 NOTE — Telephone Encounter (Signed)
Patient is calling and need provider to call  regarding the following medication schedule for ADDERALL   She is wanting to get the refills straightened out   Pt thought she had 6 months going to pharmacy but wanted to discuss before new Script is sent .  And also wants to let provider know she is completely out

## 2020-10-18 ENCOUNTER — Other Ambulatory Visit: Payer: Self-pay | Admitting: Registered Nurse

## 2020-10-18 DIAGNOSIS — F909 Attention-deficit hyperactivity disorder, unspecified type: Secondary | ICD-10-CM

## 2020-10-18 MED ORDER — AMPHETAMINE-DEXTROAMPHET ER 30 MG PO CP24: 30.0000 mg | ORAL_CAPSULE | ORAL | 0 refills | Status: DC

## 2020-10-18 MED ORDER — AMPHETAMINE-DEXTROAMPHETAMINE 15 MG PO TABS
15.0000 mg | ORAL_TABLET | Freq: Two times a day (BID) | ORAL | 0 refills | Status: DC
Start: 2020-10-18 — End: 2020-12-17

## 2020-10-18 NOTE — Telephone Encounter (Signed)
If we could clarify - once daily or twice daily?  Thank you  Rich

## 2020-10-18 NOTE — Telephone Encounter (Signed)
Pt. Called requesting the 15mg  be changed to 20mg  Adderall

## 2020-10-18 NOTE — Telephone Encounter (Signed)
Please help with Adderall question.  Janace Hoard MD

## 2020-11-28 NOTE — Telephone Encounter (Signed)
Pt had called previously about getting increase to 20 mg she states 20 mg 2 times daily is requested

## 2020-11-29 ENCOUNTER — Other Ambulatory Visit: Payer: Self-pay | Admitting: Registered Nurse

## 2020-11-29 DIAGNOSIS — F909 Attention-deficit hyperactivity disorder, unspecified type: Secondary | ICD-10-CM

## 2020-11-29 NOTE — Telephone Encounter (Signed)
Requested medication (s) are due for refill today - no  Requested medication (s) are on the active medication list -yes  Future visit scheduled -no  Last refill: 10/18/20 #90  Notes to clinic: Request RF non delegated Rx  Requested Prescriptions  Pending Prescriptions Disp Refills   amphetamine-dextroamphetamine (ADDERALL XR) 30 MG 24 hr capsule 90 capsule 0    Sig: Take 1 capsule (30 mg total) by mouth every morning.      Not Delegated - Psychiatry:  Stimulants/ADHD Failed - 11/29/2020  2:55 PM      Failed - This refill cannot be delegated      Failed - Urine Drug Screen completed in last 360 days      Passed - Valid encounter within last 3 months    Recent Outpatient Visits           2 months ago Attention deficit hyperactivity disorder (ADHD), unspecified ADHD type   [facility], Gerlene Burdock, NP   6 months ago Attention deficit hyperactivity disorder (ADHD), unspecified ADHD type   [facility], Gerlene Burdock, NP   9 months ago Attention deficit hyperactivity disorder (ADHD), unspecified ADHD type   [facility], Gerlene Burdock, NP   1 year ago Attention deficit hyperactivity disorder (ADHD), unspecified ADHD type   [facility], Richard, NP   1 year ago Attention deficit hyperactivity disorder (ADHD), unspecified ADHD type   [facility], Gerlene Burdock, NP                    Requested Prescriptions  Pending Prescriptions Disp Refills   amphetamine-dextroamphetamine (ADDERALL XR) 30 MG 24 hr capsule 90 capsule 0    Sig: Take 1 capsule (30 mg total) by mouth every morning.      Not Delegated - Psychiatry:  Stimulants/ADHD Failed - 11/29/2020  2:55 PM      Failed - This refill cannot be delegated      Failed - Urine Drug Screen completed in last 360 days      Passed - Valid encounter within last 3 months    Recent Outpatient Visits           2 months ago Attention deficit hyperactivity disorder  (ADHD), unspecified ADHD type   [facility], Gerlene Burdock, NP   6 months ago Attention deficit hyperactivity disorder (ADHD), unspecified ADHD type   [facility], Gerlene Burdock, NP   9 months ago Attention deficit hyperactivity disorder (ADHD), unspecified ADHD type   [facility], Gerlene Burdock, NP   1 year ago Attention deficit hyperactivity disorder (ADHD), unspecified ADHD type   [facility], Gerlene Burdock, NP   1 year ago Attention deficit hyperactivity disorder (ADHD), unspecified ADHD type   [facility], Gerlene Burdock, NP

## 2020-11-29 NOTE — Telephone Encounter (Signed)
Copied from CRM [id number]. Topic: Quick Communication - Rx Refill/Question >> Nov 29, 2020  2:51 PM Jaquita Rector A wrote: Medication: amphetamine-dextroamphetamine (ADDERALL XR) 30 MG 24 hr capsule   Has the patient contacted their pharmacy? Yes.   (Agent: If no, request that the patient contact the pharmacy for the refill.) (Agent: If yes, when and what did the pharmacy advise?)  Preferred Pharmacy (with phone number or street name): [facility] #[id number] [place], [place] - [address] AT [facility] [address] [facility] [address] [facility] [address]  Phone:  [phone] Fax:  [phone]     Agent: Please be advised that RX refills may take up to 3 business days. We ask that you follow-up with your pharmacy.

## 2020-11-29 NOTE — Telephone Encounter (Signed)
Refill?  Last seen 09/10/20 Last filled 10/18/20 For #30

## 2020-12-01 MED ORDER — AMPHETAMINE-DEXTROAMPHET ER 30 MG PO CP24: 30.0000 mg | ORAL_CAPSULE | ORAL | 0 refills | Status: DC

## 2020-12-04 NOTE — Telephone Encounter (Signed)
I believe twice daily. Pt. And pharmacy did not mention any changes to frequency of dosage

## 2020-12-16 ENCOUNTER — Telehealth: Payer: Self-pay

## 2020-12-16 NOTE — Telephone Encounter (Signed)
Pt. Called requesting refills on medications

## 2020-12-17 ENCOUNTER — Other Ambulatory Visit: Payer: Self-pay | Admitting: Registered Nurse

## 2020-12-17 DIAGNOSIS — F909 Attention-deficit hyperactivity disorder, unspecified type: Secondary | ICD-10-CM

## 2020-12-17 MED ORDER — AMPHETAMINE-DEXTROAMPHET ER 30 MG PO CP24: 30.0000 mg | ORAL_CAPSULE | ORAL | 0 refills | Status: DC

## 2020-12-17 MED ORDER — AMPHETAMINE-DEXTROAMPHETAMINE 15 MG PO TABS: 15.0000 mg | ORAL_TABLET | Freq: Two times a day (BID) | ORAL | 0 refills | Status: DC

## 2020-12-17 NOTE — Telephone Encounter (Signed)
Patient is requesting a refill of the following medications: Requested Prescriptions    No prescriptions requested or ordered in this encounter    Adderall xr and venlafaxine xr  Date of patient request: 12/16/20 Last office visit: 09/10/20 Date of last refill: 12/01/20 and 09/04/20 Last refill amount:  Follow up time period per chart:

## 2021-02-28 ENCOUNTER — Encounter (HOSPITAL_COMMUNITY): Payer: Self-pay | Admitting: Emergency Medicine

## 2021-02-28 ENCOUNTER — Other Ambulatory Visit: Payer: Self-pay

## 2021-02-28 ENCOUNTER — Emergency Department (HOSPITAL_COMMUNITY)
Admission: EM | Admit: 2021-02-28 | Discharge: 2021-03-01 | Disposition: A | Discharge status: Other Acute Inpatient Hospital | Payer: Medicaid Other | Attending: Emergency Medicine | Admitting: Emergency Medicine

## 2021-02-28 DIAGNOSIS — X789XXA Intentional self-harm by unspecified sharp object, initial encounter: Secondary | ICD-10-CM | POA: Diagnosis not present

## 2021-02-28 DIAGNOSIS — G2571 Drug induced akathisia: Secondary | ICD-10-CM | POA: Insufficient documentation

## 2021-02-28 DIAGNOSIS — R000 Tachycardia, unspecified: Secondary | ICD-10-CM | POA: Diagnosis not present

## 2021-02-28 DIAGNOSIS — Z20822 Contact with and (suspected) exposure to covid-19: Secondary | ICD-10-CM | POA: Diagnosis not present

## 2021-02-28 DIAGNOSIS — Y906 Blood alcohol level of 120-199 mg/100 ml: Secondary | ICD-10-CM | POA: Insufficient documentation

## 2021-02-28 DIAGNOSIS — F10129 Alcohol abuse with intoxication, unspecified: Secondary | ICD-10-CM | POA: Diagnosis not present

## 2021-02-28 DIAGNOSIS — T405X2A Poisoning by cocaine, intentional self-harm, initial encounter: Secondary | ICD-10-CM | POA: Insufficient documentation

## 2021-02-28 DIAGNOSIS — G2581 Restless legs syndrome: Secondary | ICD-10-CM | POA: Diagnosis not present

## 2021-02-28 DIAGNOSIS — T1491XA Suicide attempt, initial encounter: Secondary | ICD-10-CM | POA: Insufficient documentation

## 2021-02-28 DIAGNOSIS — F1990 Other psychoactive substance use, unspecified, uncomplicated: Secondary | ICD-10-CM

## 2021-02-28 DIAGNOSIS — F419 Anxiety disorder, unspecified: Secondary | ICD-10-CM | POA: Insufficient documentation

## 2021-02-28 DIAGNOSIS — Z046 Encounter for general psychiatric examination, requested by authority: Secondary | ICD-10-CM | POA: Diagnosis present

## 2021-02-28 DIAGNOSIS — F17210 Nicotine dependence, cigarettes, uncomplicated: Secondary | ICD-10-CM | POA: Diagnosis not present

## 2021-02-28 DIAGNOSIS — F909 Attention-deficit hyperactivity disorder, unspecified type: Secondary | ICD-10-CM | POA: Diagnosis not present

## 2021-02-28 DIAGNOSIS — T50902A Poisoning by unspecified drugs, medicaments and biological substances, intentional self-harm, initial encounter: Secondary | ICD-10-CM

## 2021-02-28 LAB — COMPREHENSIVE METABOLIC PANEL
ALT: 27 U/L (ref 0–44)
AST: 26 U/L (ref 15–41)
Albumin: 4.6 g/dL (ref 3.5–5.0)
Alkaline Phosphatase: 86 U/L (ref 38–126)
Anion gap: 7 (ref 5–15)
BUN: 13 mg/dL (ref 6–20)
CO2: 30 mmol/L (ref 22–32)
Calcium: 9.9 mg/dL (ref 8.9–10.3)
Chloride: 106 mmol/L (ref 98–111)
Creatinine, Ser: 0.84 mg/dL (ref 0.44–1.00)
GFR, Estimated: >60 mL/min (ref >60)
Glucose, Bld: 83 mg/dL (ref 70–99)
Potassium: 4 mmol/L (ref 3.5–5.1)
Sodium: 143 mmol/L (ref 135–145)
Total Bilirubin: 0.1 mg/dL — ABNORMAL LOW (ref 0.3–1.2)
Total Protein: 8.4 g/dL — ABNORMAL HIGH (ref 6.5–8.1)

## 2021-02-28 LAB — CBC
HCT: 44.5 % (ref 36.0–46.0)
Hemoglobin: 14.4 g/dL (ref 12.0–15.0)
MCH: 30.4 pg (ref 26.0–34.0)
MCHC: 32.4 g/dL (ref 30.0–36.0)
MCV: 94.1 fL (ref 80.0–100.0)
Platelets: 389 10*3/uL (ref 150–400)
RBC: 4.73 MIL/uL (ref 3.87–5.11)
RDW: 13.5 % (ref 11.5–15.5)
WBC: 10.6 10*3/uL — ABNORMAL HIGH (ref 4.0–10.5)
nRBC: 0 % (ref 0.0–0.2)

## 2021-02-28 LAB — RESP PANEL BY RT-PCR (FLU A&B, COVID) ARPGX2
Influenza A by PCR: NEGATIVE
Influenza B by PCR: NEGATIVE
SARS Coronavirus 2 by RT PCR: NEGATIVE

## 2021-02-28 LAB — RAPID URINE DRUG SCREEN, HOSP PERFORMED
Amphetamines: NOT DETECTED
Barbiturates: NOT DETECTED
Benzodiazepines: NOT DETECTED
Cocaine: POSITIVE — AB
Opiates: NOT DETECTED
Tetrahydrocannabinol: NOT DETECTED

## 2021-02-28 LAB — I-STAT BETA HCG BLOOD, ED (MC, WL, AP ONLY): I-stat hCG, quantitative: <5 m[IU]/mL (ref <5)

## 2021-02-28 LAB — SALICYLATE LEVEL: Salicylate Lvl: <7 mg/dL — ABNORMAL LOW (ref 7.0–30.0)

## 2021-02-28 LAB — CBG MONITORING, ED: Glucose-Capillary: 83 mg/dL (ref 70–99)

## 2021-02-28 LAB — ACETAMINOPHEN LEVEL: Acetaminophen (Tylenol), Serum: <10 ug/mL — ABNORMAL LOW (ref 10–30)

## 2021-02-28 LAB — ETHANOL: Alcohol, Ethyl (B): 166 mg/dL — ABNORMAL HIGH (ref <10)

## 2021-02-28 MED ORDER — VENLAFAXINE HCL ER 75 MG PO CP24: 150.0000 mg | ORAL_CAPSULE | Freq: Every day | ORAL | Status: DC

## 2021-02-28 MED ORDER — NICOTINE 21 MG/24HR TD PT24: 21.0000 mg | MEDICATED_PATCH | Freq: Every day | TRANSDERMAL | Status: DC | PRN

## 2021-02-28 MED ORDER — SODIUM CHLORIDE 0.9 % IV BOLUS
1000.0000 mL | Freq: Once | INTRAVENOUS | Status: AC
  Administered 2021-02-28: 1000 mL via INTRAVENOUS

## 2021-02-28 NOTE — BH Assessment (Addendum)
Clinician to assess pt once IVC paperwork has been received, discussed with Melene Muller, RN.    Redmond Pulling, MS, [facility], [facility] Triage Specialist [phone]

## 2021-02-28 NOTE — BH Assessment (Signed)
Comprehensive Clinical Assessment (CCA) Note  02/28/2021 [name] [id number]   Disposition: Cecilio Asper, NP recommends pt to be admitted to [facility] Continuous Observation Unit. Disposition discussed with Dr. Anitra Lauth and Melene Muller, RN.   Flowsheet Row ED from 02/28/2021 in [facility]GENCY [facility]  C-SSRS RISK CATEGORY High Risk     The patient demonstrates the following risk factors for suicide: Chronic risk factors for suicide include: previous suicide attempts Per EMS this was a suicide attempt. Acute risk factors for suicide include: Pt denies. Protective factors for this patient include: positive social support. Considering these factors, the overall suicide risk at this point appears to be high. Patient is appropriate for outpatient follow up.  Crystal Webb is a 45 year old female who presents voluntary and unaccompanied to [facility]. Clinician asked the pt, "what brought you to the hospital?" Pt reported, she took too many (a handful) Benadryl to go to sleep, usually they don't work. Pt reports, she took the Benadryl with water. Pt reports, her boyfriend was very concerned when he seen how many she took and called 911. Pt reports, she was not trying to kill herself when she took the Benadryl. Pt denies, SI, HI, AVH, self-injurious behaviors and access to weapons.   Pt was IVC'd by EDP/PA. Per IVC paperwork: "Patient here after intentional overdose on 20, 25 mg Benadryl's and alcohol in an attempt of suicide per EMS report. Danger to self."   Pt reports, drinking a couple Vodka drinks two nights ago. Per EDP/PA note: "EMS reports that the patient took approximately 20, 25 mg Benadryl's along with an unknown amount of alcohol prior to arrival." Pt's BAL was 166 at 0821 on 02/28/2021. Per EDP/PA note: "She states that she shoots cocaine." Pt denies, cocaine use. Pt's UDS is positive for cocaine. Pt denies, being linked to OPT resources (medication management and/or  counseling.) Pt denies, previous inpatient admissions.   Pt presents guarded in scrubs with normal speech. Pt's mood, affect was sad. Pt's thought content was appropriate to mood and circumstances. Pt's insight, judgement was poor. Clinician discussed the three possible dispositions (discharged with OPT resources, observe/reassess by psychiatry or inpatient treatment) in detail. Pt reports if discharged she can contract for safety.  Diagnosis: Unspecified Depressive Disorder.  *Pt declined for clinician to contact support to obtain additional information.*   Chief Complaint:  Chief Complaint  Patient presents with  . Drug Overdose  . Suicidal   Visit Diagnosis:     CCA Screening, Triage and Referral (STR)  Patient Reported Information How did you hear about [place]? Other (Comment)  Referral name: Ambulance/911  Referral phone number: 911   Whom do you see for routine medical problems? Primary Care  Practice/Facility Name: Nature conservation officer at [facility].  Practice/Facility Phone Number: [phone]  Name of Contact: Letitia Neri, AGNP.  Contact Number: [phone]  Contact Fax Number: Letitia Neri, AGNP.  Prescriber Name: No data recorded Prescriber Address (if known): [address]  [place], [place] [address].   What Is the Reason for Your Visit/Call Today? Per pt she took too many (a handful) Benadryl, usually they don't work her boyfriend called 911.  How Long Has This Been Causing You Problems? No data recorded What Do You Feel Would Help You the Most Today? Treatment for Depression or other mood problem   Have You Recently Been in Any Inpatient Treatment (Hospital/Detox/Crisis Center/28-Day Program)? No  Name/Location of Program/Hospital:No data recorded How Long Were You There? No data  recorded When Were You Discharged? No data recorded  Have You Ever Received Services From [facility] Before? Yes  Who Do You See at [facility]? Pt's PCP  is through [facility].   Have You Recently Had Any Thoughts About Hurting Yourself? No (Pt denies.)  Are You Planning to Commit Suicide/Harm Yourself At This time? No   Have you Recently Had Thoughts About Hurting Someone Karolee Ohs? No  Explanation: No data recorded  Have You Used Any Alcohol or Drugs in the Past 24 Hours? No (Pt reports drinking alcohol two nights ago.)  How Long Ago Did You Use Drugs or Alcohol? No data recorded What Did You Use and How Much? No data recorded  Do You Currently Have a Therapist/Psychiatrist? No  Name of Therapist/Psychiatrist: No data recorded  Have You Been Recently Discharged From Any Office Practice or Programs? No data recorded Explanation of Discharge From Practice/Program: No data recorded    CCA Screening Triage Referral Assessment Type of Contact: Tele-Assessment  Is this Initial or Reassessment? Initial Assessment  Date Telepsych consult ordered in [facility]:  02/28/2021  Time Telepsych consult ordered in [facility]:  1339   Patient Reported Information Reviewed? Yes  Patient Left Without Being Seen? No data recorded Reason for Not Completing Assessment: No data recorded  Collateral Involvement: Pt reports having supports but declined clinician to contact supports to obtain addtional information.   Does Patient Have a Automotive engineer Guardian? No data recorded Name and Contact of Legal Guardian: No data recorded If Minor and Not Living with Parent(s), Who has Custody? No data recorded Is CPS involved or ever been involved? No data recorded Is APS involved or ever been involved? No data recorded  Patient Determined To Be At Risk for Harm To Self or Others Based on Review of Patient Reported Information or Presenting Complaint? No data recorded Method: No data recorded Availability of Means: No data recorded Intent: No data recorded Notification Required: No data recorded Additional Information for Danger to Others Potential: No data  recorded Additional Comments for Danger to Others Potential: No data recorded Are There Guns or Other Weapons in Your Home? No data recorded Types of Guns/Weapons: No data recorded Are These Weapons Safely Secured?                            No data recorded Who Could Verify You Are Able To Have These Secured: No data recorded Do You Have any Outstanding Charges, Pending Court Dates, Parole/Probation? No data recorded Contacted To Inform of Risk of Harm To Self or Others: No data recorded  [facility] of Assessment: [facility] ED   Does Patient Present under Involuntary Commitment? Yes  IVC Papers Initial File Date: 02/28/2021   [place] of Residence: [place]   Patient Currently Receiving the Following Services: Not Receiving Services   Determination of Need: No data recorded  Options For Referral: [facility] Urgent Care; Inpatient Hospitalization     CCA Biopsychosocial Intake/Chief Complaint:  Per EDP/PA note: " is a 45 y.o. female who presents with a cc of overdose. She admits to a history of IV drug use. She states that she shoots cocaine.  EMS reports that the patient took approximately 20, 25 mg Benadryl's along with an unknown amount of alcohol prior to arrival. On sure about the time of ingestion. She arrived at 8:04 AM.  EMS reports that she attempted suicide. Patient states that she got in a fight with her boyfriend  who kicked her out. Patient is currently intoxicated but states that she was only "trying to go to sleep."  She has no previous history of suicide attempt or psychiatric admissions here. She is complaining of severe restless legs. Claims last IVDU 2 days ago."  Current Symptoms/Problems: Pt took a handful of Benadryl. Pt denies she was trying to kill herself.   Patient Reported Schizophrenia/Schizoaffective Diagnosis in Past: No data recorded  Strengths: Pt has supports.  Preferences: NA  Abilities: NA   Type of Services Patient Feels are Needed: Pt reports, if  discharged she can contract for safety.   Initial Clinical Notes/Concerns: No data recorded  Mental Health Symptoms Depression:  Sleep (too much or little)   Duration of Depressive symptoms: No data recorded  Mania:  None   Anxiety:   None   Psychosis:  None   Duration of Psychotic symptoms: No data recorded  Trauma:  None   Obsessions:  None   Compulsions:  None   Inattention:  None   Hyperactivity/Impulsivity:  N/A   Oppositional/Defiant Behaviors:  None   Emotional Irregularity:  None   Other Mood/Personality Symptoms:  No data recorded   Mental Status Exam Appearance and self-care  Stature:  Average   Weight:  Average weight   Clothing:  -- (Pt in scrubs.)   Grooming:  Normal   Cosmetic use:  None   Posture/gait:  Normal   Motor activity:  Not Remarkable   Sensorium  Attention:  Normal   Concentration:  Normal   Orientation:  X5   Recall/memory:  Normal   Affect and Mood  Affect:  Other (Comment) (Sad.)   Mood:  Other (Comment) (Sad.)   Relating  Eye contact:  Normal   Facial expression:  Depressed   Attitude toward examiner:  Cooperative; Guarded   Thought and Language  Speech flow: Normal   Thought content:  Appropriate to Mood and Circumstances   Preoccupation:  None   Hallucinations:  None   Organization:  No data recorded  Affiliated Computer ServicesExecutive Functions  Fund of Knowledge:  Fair   Intelligence:  No data recorded  Abstraction:  No data recorded  Judgement:  Poor   Reality Testing:  No data recorded  Insight:  Poor   Decision Making:  Impulsive   Social Functioning  Social Maturity:  Impulsive   Social Judgement:  No data recorded  Stress  Stressors:  Other (Comment) (Pt denies stressors.)   Coping Ability:  No data recorded  Skill Deficits:  Self-control; Decision making   Supports:  Family     Religion: Religion/Spirituality Are You A Religious Person?: Yes What is Your Religious Affiliation?:  Catholic  Leisure/Recreation: Leisure / Recreation Do You Have Hobbies?: Yes Leisure and Hobbies: Like to read, going for walks, arts/crafts.  Exercise/Diet: Exercise/Diet Do You Exercise?: Yes What Type of Exercise Do You Do?: Run/Walk Do You Follow a Special Diet?: No Do You Have Any Trouble Sleeping?: Yes Explanation of Sleeping Difficulties: Pt not sleeping well at times and she takes Benadryl.   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 How long has patient been employed?: 5 years. Has patient ever been in the [employer] and Companymilitary?: No  Education: Education Is Patient Currently Attending School?: No Last Grade Completed: 12 Did You Graduate From [employer]?: Yes Did You Attend College?: Yes What Type of College Degree Do you Have?: [employer] and [employer].   CCA Family/Childhood History Family and  Relationship History: Family history Marital status: Single Does patient have children?: Yes How many children?: 1 How is patient's relationship with their children?: Pt reports she has a 14 year old son that lives with his grandmother.  Childhood History:  Childhood History Does patient have siblings?: Yes Number of Siblings: 3 Did patient suffer any verbal/emotional/physical/sexual abuse as a child?: No Did patient suffer from severe childhood neglect?: No Has patient ever been sexually abused/assaulted/raped as an adolescent or adult?: No Witnessed domestic violence?: No Has patient been affected by domestic violence as an adult?:  (NA)  Child/Adolescent Assessment:     CCA Substance Use Alcohol/Drug Use: Alcohol / Drug Use Pain Medications: See MAR Prescriptions: See MAR Over the Counter: See MAR History of alcohol / drug use?: Yes Substance #1 Name of Substance 1: Alochol. 1 - Age of First Use: UTA 1 - Amount (size/oz): Pt  reports, drinking a couple Vodka drinks two nights ago. Pt's BAL was 166 at 0821. Per EDP/PA note: "EMS reports that the patient took approximately 20, 25 mg Benadryl's along with an unknown amount of alcohol prior to arrival." 1 - Frequency: Pt reports, "a few times per month." 1 - Duration: Ongoing. 1 - Last Use / Amount: Per chart, today (02/28/2021). Per pt, two nights ago. 1 - Method of Aquiring: UTA 1- Route of Use: Oral. Substance #2 Name of Substance 2: Cocaine. 2 - Age of First Use: UTA 2 - Amount (size/oz): Per EDP/PA note: "She states that she shoots cocaine." Pt denies, cocaine use. Pt's UDS is positive for cocaine. 2 - Frequency: UTA 2 - Duration: UTA 2 - Last Use / Amount: UTA 2 - Method of Aquiring: UTA 2 - Route of Substance Use: UTA     ASAM's:  Six Dimensions of Multidimensional Assessment  Dimension 1:  Acute Intoxication and/or Withdrawal Potential:      Dimension 2:  Biomedical Conditions and Complications:      Dimension 3:  Emotional, Behavioral, or Cognitive Conditions and Complications:     Dimension 4:  Readiness to Change:     Dimension 5:  Relapse, Continued use, or Continued Problem Potential:     Dimension 6:  Recovery/Living Environment:     ASAM Severity Score:    ASAM Recommended Level of Treatment:     Substance use Disorder (SUD)    Recommendations for Services/Supports/Treatments:    DSM5 Diagnoses: Patient Active Problem List   Diagnosis Date Noted  . Anxiety state 01/17/2019  . Attention deficit hyperactivity disorder (ADHD) 01/17/2019    Referrals to Alternative Service(s): Referred to Alternative Service(s):   Place:   Date:   Time:    Referred to Alternative Service(s):   Place:   Date:   Time:    Referred to Alternative Service(s):   Place:   Date:   Time:    Referred to Alternative Service(s):   Place:   Date:   Time:     Redmond Pulling, [facility]  Comprehensive Clinical Assessment (CCA) Screening, Triage and Referral  Note  02/28/2021 Briseis Aguilera [id number]  Chief Complaint:  Chief Complaint  Patient presents with  . Drug Overdose  . Suicidal   Visit Diagnosis:   Patient Reported Information How did you hear about [place]? Other (Comment)   Referral name: Ambulance/911   Referral phone number: 911  Whom do you see for routine medical problems? Primary Care   Practice/Facility Name: Nature conservation officer at [facility].   Practice/Facility Phone Number: [phone]   Name of  Contact: Letitia Neri, AGNP.   Contact Number: [phone]   Contact Fax Number: Letitia Neri, AGNP.   Prescriber Name: No data recorded  Prescriber Address (if known): [address]  [place], [place] [address].  What Is the Reason for Your Visit/Call Today? Per pt she took too many (a handful) Benadryl, usually they don't work her boyfriend called 911.  How Long Has This Been Causing You Problems? No data recorded Have You Recently Been in Any Inpatient Treatment (Hospital/Detox/Crisis Center/28-Day Program)? No   Name/Location of Program/Hospital:No data recorded  How Long Were You There? No data recorded  When Were You Discharged? No data recorded Have You Ever Received Services From [facility] Before? Yes   Who Do You See at [facility]? Pt's PCP is through [facility].  Have You Recently Had Any Thoughts About Hurting Yourself? No (Pt denies.)   Are You Planning to Commit Suicide/Harm Yourself At This time?  No  Have you Recently Had Thoughts About Hurting Someone Karolee Ohs? No   Explanation: No data recorded Have You Used Any Alcohol or Drugs in the Past 24 Hours? No (Pt reports drinking alcohol two nights ago.)   How Long Ago Did You Use Drugs or Alcohol?  No data recorded  What Did You Use and How Much? No data recorded What Do You Feel Would Help You the Most Today? Treatment for Depression or other mood problem  Do You Currently Have a Therapist/Psychiatrist? No   Name of  Therapist/Psychiatrist: No data recorded  Have You Been Recently Discharged From Any Office Practice or Programs? No data recorded  Explanation of Discharge From Practice/Program:  No data recorded    CCA Screening Triage Referral Assessment Type of Contact: Tele-Assessment   Is this Initial or Reassessment? Initial Assessment   Date Telepsych consult ordered in [facility]:  02/28/2021   Time Telepsych consult ordered in [facility]:  1339  Patient Reported Information Reviewed? Yes   Patient Left Without Being Seen? No data recorded  Reason for Not Completing Assessment: No data recorded Collateral Involvement: Pt reports having supports but declined clinician to contact supports to obtain addtional information.  Does Patient Have a Automotive engineer Guardian? No data recorded  Name and Contact of Legal Guardian:  No data recorded If Minor and Not Living with Parent(s), Who has Custody? No data recorded Is CPS involved or ever been involved? No data recorded Is APS involved or ever been involved? No data recorded Patient Determined To Be At Risk for Harm To Self or Others Based on Review of Patient Reported Information or Presenting Complaint? No data recorded  Method: No data recorded  Availability of Means: No data recorded  Intent: No data recorded  Notification Required: No data recorded  Additional Information for Danger to Others Potential:  No data recorded  Additional Comments for Danger to Others Potential:  No data recorded  Are There Guns or Other Weapons in Your Home?  No data recorded   Types of Guns/Weapons: No data recorded   Are These Weapons Safely Secured?                              No data recorded   Who Could Verify You Are Able To Have These Secured:    No data recorded Do You Have any Outstanding Charges, Pending Court Dates, Parole/Probation? No data recorded Contacted To Inform of Risk of Harm To Self or  Others: No data recorded Location of Assessment: [facility]  ED  Does Patient Present under Involuntary Commitment? Yes   IVC Papers Initial File Date: 02/28/2021   [place] of Residence: [place]  Patient Currently Receiving the Following Services: Not Receiving Services   Determination of Need: No data recorded  Options For Referral: [facility] Urgent Care; Inpatient Hospitalization   Redmond Pulling, [facility]     Redmond Pulling, MS, [facility], [facility] Triage Specialist [phone]

## 2021-02-28 NOTE — ED Triage Notes (Signed)
BIBA  Per EMS: Pt coming from home with a drug overdose and suicide attempt. Pt reports taking 20 25mg  benadryl with alcohol. Pt is experiencing nausea and slurred speech currently. 120 HR; 160/100 BP; 124 CBG

## 2021-02-28 NOTE — ED Notes (Signed)
Transport contacted for patient's transfer to [facility].

## 2021-02-28 NOTE — ED Provider Notes (Signed)
[facility]-EMERGENCY DEPT Provider Note   CSN: [id number] Arrival date & time: 02/28/21  0757     History Chief Complaint  Patient presents with  . Drug Overdose  . Suicidal    Crystal Webb is a 45 y.o. female who presents with a cc of overdose. She admits to a history of IV drug use.  She states that she shoots cocaine.  EMS reports that the patient took approximately 20, 25 mg Benadryl's along with an unknown amount of alcohol prior to arrival.  On sure about the time of ingestion.  She arrived at 8:04 AM.  EMS reports that she attempted suicide. Patient states that she got in a fight with her boyfriend who kicked her out. Patient is currently intoxicated but states that she was only "trying to go to sleep." She has no previous history of suicide attempt or psychiatric admissions here.  She is complaining of severe restless legs. Claims last IVDU 2 days ago.  HPI     Past Medical History:  Diagnosis Date  . ADD (attention deficit disorder)     Patient Active Problem List   Diagnosis Date Noted  . Anxiety state 01/17/2019  . Attention deficit hyperactivity disorder (ADHD) 01/17/2019    History reviewed. No pertinent surgical history.   OB History   No obstetric history on file.     Family History  Problem Relation Age of Onset  . Stroke Mother     Social History   Tobacco Use  . Smoking status: Current Every Day Smoker    Packs/day: 0.25    Years: 10.00    Pack years: 2.50    Types: Cigarettes  . Smokeless tobacco: Never Used  Vaping Use  . Vaping Use: Never used  Substance Use Topics  . Alcohol use: Yes    Comment: occasionally  . Drug use: No    Home Medications Prior to Admission medications   Medication Sig Start Date End Date Taking? Authorizing Provider  amphetamine-dextroamphetamine (ADDERALL XR) 30 MG 24 hr capsule Take 1 capsule (30 mg total) by mouth every morning. 12/17/20   [name], NP   amphetamine-dextroamphetamine (ADDERALL) 15 MG tablet Take 1 tablet by mouth 2 (two) times daily. 12/17/20   [name], NP  venlafaxine XR (EFFEXOR-XR) 150 MG 24 hr capsule TAKE 1 CAPSULE BY MOUTH DAILY WITH BREAKFAST Patient not taking: Reported on 09/10/2020 09/04/20   [name], NP    Allergies    Patient has no known allergies.  Review of Systems   Review of Systems Unable to review systems due to ams Physical Exam Updated Vital Signs BP (!) 156/100 (BP Location: Left Arm)   Pulse 88   Temp 98.4 F (36.9 C)   Resp 20   SpO2 100%   Physical Exam Vitals and nursing note reviewed.  Constitutional:      General: She is not in acute distress.    Appearance: She is well-developed. She is not diaphoretic.     Comments: akathisia  HENT:     Head: Normocephalic and atraumatic.  Eyes:     General: No scleral icterus.    Conjunctiva/sclera: Conjunctivae normal.  Cardiovascular:     Rate and Rhythm: Regular rhythm. Tachycardia present.     Heart sounds: Normal heart sounds. No murmur heard. No friction rub. No gallop.   Pulmonary:     Effort: Pulmonary effort is normal. No respiratory distress.     Breath sounds: Normal breath sounds.  Abdominal:  General: Bowel sounds are decreased. There is no distension.     Palpations: Abdomen is soft. There is no mass.     Tenderness: There is no abdominal tenderness. There is no guarding.  Musculoskeletal:     Cervical back: Normal range of motion.  Skin:    General: Skin is warm and dry.     Comments: Fresh track marks BL arms  Neurological:     Mental Status: She is alert.     Comments: Intoxicated Slurred speech      ED Results / Procedures / Treatments   Labs (all labs ordered are listed, but only abnormal results are displayed) Labs Reviewed  COMPREHENSIVE METABOLIC PANEL - Abnormal; Notable for the following components:      Result Value   Total Protein 8.4 (*)    Total Bilirubin 0.1 (*)    All other  components within normal limits  CBC - Abnormal; Notable for the following components:   WBC 10.6 (*)    All other components within normal limits  ETHANOL  SALICYLATE LEVEL  ACETAMINOPHEN LEVEL  RAPID URINE DRUG SCREEN, HOSP PERFORMED  CBG MONITORING, ED  I-STAT BETA HCG BLOOD, ED ([facility], [facility], [facility] ONLY)    EKG None  Radiology No results found.  Procedures Procedures   Medications Ordered in ED Medications  sodium chloride 0.9 % bolus 1,000 mL (1,000 mLs Intravenous New Bag/Given 02/28/21 0900)    ED Course  I have reviewed the triage vital signs and the nursing notes.  Pertinent labs & imaging results that were available during my care of the patient were reviewed by me and considered in my medical decision making (see chart for details).  Clinical Course as of 02/28/21 6834  Fri Feb 28, 2021  0905 Benzo or barbiturates for agitation  Fluids  Make sure she has active  [AH]    Clinical Course User Index [AH] Arthor Captain, PA-C   MDM Rules/Calculators/A&P                          Patient here with intentional overdose. She was observed for period of 6 hours here in the emergency department without any significant decline in her status.  I ordered and reviewed labs that includes CBC which shows mildly elevated white blood cell count, initial alcohol level of 166, no Tylenol or salicylate elevation.  Negative pregnancy test.  COVID panel is in process.  UDS positive for cocaine.  Patient is otherwise medically clear for psychiatric evaluation.  I have placed the patient under involuntary commitment for intentional self-harm. Final Clinical Impression(s) / ED Diagnoses Final diagnoses:  None    Rx / DC Orders ED Discharge Orders    None       Arthor Captain, PA-C 02/28/21 1503    Cheryll Cockayne, MD 03/07/21 [phone]

## 2021-02-28 NOTE — ED Notes (Signed)
Patient recommended to transfer to [facility]

## 2021-03-01 ENCOUNTER — Other Ambulatory Visit: Payer: Self-pay

## 2021-03-01 ENCOUNTER — Ambulatory Visit (HOSPITAL_COMMUNITY)
Admission: EM | Admit: 2021-03-01 | Discharge: 2021-03-01 | Disposition: A | Discharge status: Home / Self Care | Payer: Medicaid Other | Source: Home / Self Care

## 2021-03-01 DIAGNOSIS — F17210 Nicotine dependence, cigarettes, uncomplicated: Secondary | ICD-10-CM | POA: Insufficient documentation

## 2021-03-01 DIAGNOSIS — F411 Generalized anxiety disorder: Secondary | ICD-10-CM

## 2021-03-01 DIAGNOSIS — Z9151 Personal history of suicidal behavior: Secondary | ICD-10-CM | POA: Insufficient documentation

## 2021-03-01 DIAGNOSIS — F909 Attention-deficit hyperactivity disorder, unspecified type: Secondary | ICD-10-CM | POA: Insufficient documentation

## 2021-03-01 DIAGNOSIS — T1491XA Suicide attempt, initial encounter: Secondary | ICD-10-CM

## 2021-03-01 MED ORDER — HYDROXYZINE HCL 25 MG PO TABS: 25.0000 mg | ORAL_TABLET | Freq: Three times a day (TID) | ORAL | Status: DC | PRN

## 2021-03-01 MED ORDER — ALUM & MAG HYDROXIDE-SIMETH 200-200-20 MG/5ML PO SUSP: 30.0000 mL | ORAL | Status: DC | PRN

## 2021-03-01 MED ORDER — ACETAMINOPHEN 325 MG PO TABS: 650.0000 mg | ORAL_TABLET | Freq: Four times a day (QID) | ORAL | Status: DC | PRN

## 2021-03-01 MED ORDER — MAGNESIUM HYDROXIDE 400 MG/5ML PO SUSP: 30.0000 mL | Freq: Every day | ORAL | Status: DC | PRN

## 2021-03-01 MED ORDER — VENLAFAXINE HCL ER 150 MG PO CP24
150.0000 mg | ORAL_CAPSULE | Freq: Every day | ORAL | Status: DC
  Administered 2021-03-01: 150 mg via ORAL
  Filled 2021-03-01: qty 1

## 2021-03-01 NOTE — ED Notes (Signed)
Patient alert and oriented X 4, denies SI, HI and AVH. Patient received after summary visit with community resources. Patient received belongings from [facility] locker.

## 2021-03-01 NOTE — ED Provider Notes (Signed)
FBC/OBS ASAP Discharge Summary  Date and Time: 03/01/2021 12:37 PM  Name: Crystal Webb  MRN:  [id number]   Discharge Diagnoses:  Final diagnoses:  GAD (generalized anxiety disorder)  Suicide attempt [facility])   Evaluation: Crystal Webb was seen and evaluated face-to-face.  Denying suicidal or homicidal ideations.  Denies auditory or visual hallucinations.  Reports verbal altercation between she and her boyfriend.  Stated things got out of hand.  Denied previous inpatient admissions.  Reports she is followed by her primary care provider who writes Adderall and Effexor.  Denied previous suicide attempts.  NP spoke to patient's significant other Michela Pitcher at [phone] for additional collateral.  John reported " feeling guilty for the events that transpired."  Stated that he slightly provoked the situation.  States were both to plan.  He denied any safety concerns with patient returning home.  Stated too much alcohol was involved.  Case staffed with attending psychiatrist Akintayo.  Will provide patient with additional outpatient resources.  Support encouragement reassurance was provided.   Per admission assessment: Crystal Webb is a 45 year old female with a history of GAD presenting to the [facility] under IVC via transfer from [facility] s/p suicide attempt from overdose of (20) 25mg  Benadryl. Patient is single and lives with boyfriend has one 71 year old son that lives with his grandmother. 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 Patient denies that she attempted suicide by taking Benadryl, patient stated that she was simply trying to get to sleep. Patient denies any SI or AVH or HI or self injurious behaviors..  Patient currently denies any anxiety symptoms, denies any feelings of hopelessness or worthlessness. Patient denies any previous inpatient hospitalization.  Patient is currently being treated in the outpatient setting by International aid/development worker NP and is currently being  prescribed Effexor 150 mg for anxiety and Adderall XR 30 and Adderall 15 for ADHD symptoms. Patient reports that the effexor 150mg  has been effective in managing her anxiety symptoms.During my assessment patient reported an history of cocaine use but stated most recent use was several years ago, however UDS was positive for cocaine. Patient appears to minimize her symptoms and has displayed  poor insight to her behaviors. Patient reports 1/10 anxiety symptoms and 1/10 depression symptoms. Patient denies any family history of mental illness. Patient denies any trauma history.   Total Time spent with patient: 15 minutes  Past Psychiatric History: Past Medical History:  Past Medical History:  Diagnosis Date  . ADD (attention deficit disorder)    No past surgical history on file. Family History:  Family History  Problem Relation Age of Onset  . Stroke Mother    Family Psychiatric History:  Social History:  Social History   Substance and Sexual Activity  Alcohol Use Yes   Comment: occasionally     Social History   Substance and Sexual Activity  Drug Use No    Social History   Socioeconomic History  . Marital status: Single    Spouse name: Not on file  . Number of children: Not on file  . Years of education: Not on file  . Highest education level: Not on file  Occupational History  . Not on file  Tobacco Use  . Smoking status: Current Every Day Smoker    Packs/day: 0.25    Years: 10.00    Pack years: 2.50    Types: Cigarettes  . Smokeless tobacco: Never Used  Vaping Use  . Vaping Use: Never used  Substance and Sexual Activity  .  Alcohol use: Yes    Comment: occasionally  . Drug use: No  . Sexual activity: Not Currently  Other Topics Concern  . Not on file  Social History Narrative  . Not on file   Social Determinants of Health   Financial Resource Strain: Not on file  Food Insecurity: Not on file  Transportation Needs: Not on file  Physical Activity: Not on  file  Stress: Not on file  Social Connections: Not on file   SDOH:  SDOH Screenings   Alcohol Screen: Not on file  Depression (PHQ2-9): Low Risk   . PHQ-2 Score: 0  Financial Resource Strain: Not on file  Food Insecurity: Not on file  Housing: Not on file  Physical Activity: Not on file  Social Connections: Not on file  Stress: Not on file  Tobacco Use: High Risk  . Smoking Tobacco Use: Current Every Day Smoker  . Smokeless Tobacco Use: Never Used  Transportation Needs: Not on file    Has this patient used any form of tobacco in the last 30 days? (Cigarettes, Smokeless Tobacco, Cigars, and/or Pipes) Prescription not provided because: non smoker  Current Medications:  Current Facility-Administered Medications  Medication Dose Route Frequency Provider Last Rate Last Admin  . acetaminophen (TYLENOL) tablet 650 mg  650 mg Oral Q6H PRN Bobbitt, Shalon E, NP      . alum & mag hydroxide-simeth (MAALOX/MYLANTA) 200-200-20 MG/5ML suspension 30 mL  30 mL Oral Q4H PRN Bobbitt, Shalon E, NP      . hydrOXYzine (ATARAX/VISTARIL) tablet 25 mg  25 mg Oral TID PRN Bobbitt, Shalon E, NP      . magnesium hydroxide (MILK OF MAGNESIA) suspension 30 mL  30 mL Oral Daily PRN Bobbitt, Shalon E, NP      . venlafaxine XR (EFFEXOR-XR) 24 hr capsule 150 mg  150 mg Oral Q breakfast Bobbitt, Shalon E, NP   150 mg at 03/01/21 1207   Current Outpatient Medications  Medication Sig Dispense Refill  . amphetamine-dextroamphetamine (ADDERALL XR) 30 MG 24 hr capsule Take 1 capsule (30 mg total) by mouth every morning. 90 capsule 0  . amphetamine-dextroamphetamine (ADDERALL) 15 MG tablet Take 1 tablet by mouth 2 (two) times daily. (Patient not taking: Reported on 02/28/2021) 180 tablet 0  . venlafaxine XR (EFFEXOR-XR) 150 MG 24 hr capsule TAKE 1 CAPSULE BY MOUTH DAILY WITH BREAKFAST (Patient taking differently: Take by mouth daily with breakfast.) 90 capsule 0    PTA Medications: (Not in a hospital  admission)   Musculoskeletal  Strength & Muscle Tone: within normal limits Gait & Station: normal Patient leans: N/A  Psychiatric Specialty Exam  Presentation  General Appearance: Appropriate for Environment  Eye Contact:Good  Speech:Clear and Coherent  Speech Volume:Normal  Handedness:Right   Mood and Affect  Mood:Anxious  Affect:Flat   Thought Process  Thought Processes:Coherent  Descriptions of Associations:Intact  Orientation:Full (Time, Place and Person)  Thought Content:WDL     Hallucinations:Hallucinations: None  Ideas of Reference:None  Suicidal Thoughts:Suicidal Thoughts: No  Homicidal Thoughts:Homicidal Thoughts: No   Sensorium  Memory:Recent Fair; Remote Fair  Judgment:Fair  Insight:Fair   Executive Functions  Concentration:Good  Attention Span:Good  Recall:Good  Fund of Knowledge:Good  Language:Good   Psychomotor Activity  Psychomotor Activity:Psychomotor Activity: Normal   Assets  Assets:Communication Skills; Housing; Health and safety inspector; Physical Health; Social Lawyer; Resilience   Sleep  Sleep:Sleep: Poor Number of Hours of Sleep: -1   Nutritional Assessment (For OBS and FBC admissions only) Has the patient had  a weight loss or gain of 10 pounds or more in the last 3 months?: No Has the patient had a decrease in food intake/or appetite?: No Does the patient have dental problems?: No Does the patient have eating habits or behaviors that may be indicators of an eating disorder including binging or inducing vomiting?: No Has the patient recently lost weight without trying?: No Has the patient been eating poorly because of a decreased appetite?: No Malnutrition Screening Tool Score: 0    Physical Exam  Physical Exam Vitals and nursing note reviewed.  HENT:     Head: Normocephalic.  Cardiovascular:     Rate and Rhythm: Normal rate and regular rhythm.  Psychiatric:        Attention and  Perception: Attention and perception normal.        Mood and Affect: Mood normal.        Speech: Speech normal.        Behavior: Behavior normal.        Thought Content: Thought content normal.        Cognition and Memory: Cognition normal.    ROS Blood pressure (!) 142/103, pulse 74, temperature (!) 97.3 F (36.3 C), temperature source Tympanic, resp. rate 18, SpO2 100 %. There is no height or weight on file to calculate BMI.  Demographic Factors:  Caucasian  Loss Factors: Financial problems/change in socioeconomic status  Historical Factors: Family history of mental illness or substance abuse and Impulsivity  Risk Reduction Factors:   Sense of responsibility to family, Positive social support, Positive therapeutic relationship and Positive coping skills or problem solving skills  Continued Clinical Symptoms:  Severe Anxiety and/or Agitation Depression:   Comorbid alcohol abuse/dependence  Cognitive Features That Contribute To Risk:  Closed-mindedness    Suicide Risk:  Minimal: No identifiable suicidal ideation.  Patients presenting with no risk factors but with morbid ruminations; may be classified as minimal risk based on the severity of the depressive symptoms  Plan Of Care/Follow-up recommendations:  Activity:  as tolerated Diet:  heart healthy  Disposition: Take all medications as prescribed. Keep all follow-up appointments as scheduled.  Do not consume alcohol or use illegal drugs while on prescription medications. Report any adverse effects from your medications to your primary care provider promptly.  In the event of recurrent symptoms or worsening symptoms, call 911, a crisis hotline, or go to the nearest emergency department for evaluation.   Oneta Rack, NP 03/01/2021, 12:37 PM

## 2021-03-01 NOTE — ED Notes (Signed)
Patient sleeping.  RR even and unlabored.  Continue to monitor for safety. 

## 2021-03-01 NOTE — ED Provider Notes (Signed)
Behavioral Health Admission [facility] & OBS)  Date: 03/01/21 Patient Name: Elvenia Godden MRN: [id number] Chief Complaint: Over dose on benadryl Chief Complaint  Patient presents with  . Drug Overdose      Diagnoses:  Final diagnoses:  GAD (generalized anxiety disorder)  Suicide attempt [facility])    HPI: Adiva Boettner is a 45 year old female with a history of GAD presenting to the [facility] under IVC via transfer from [facility] s/p suicide attempt from overdose of (20) 25mg  Benadryl. Patient is single and lives with boyfriend has one 32 year old son that lives with his grandmother. 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 Patient denies that she attempted suicide by taking Benadryl, patient stated that she was simply trying to get to sleep. Patient denies any SI or AVH or HI or self injurious behaviors..  Patient currently denies any anxiety symptoms, denies any feelings of hopelessness or worthlessness. Patient denies any previous inpatient hospitalization.  Patient is currently being treated in the outpatient setting by International aid/development worker NP and is currently being prescribed Effexor 150 mg for anxiety and Adderall XR 30 and Adderall 15 for ADHD symptoms. Patient reports that the effexor 150mg  has been effective in managing her anxiety symptoms.During my assessment patient reported an history of cocaine use but stated most recent use was several years ago, however UDS was positive for cocaine. Patient appears to minimize her symptoms and has displayed  poor insight to her behaviors. Patient reports 1/10 anxiety symptoms and 1/10 depression symptoms. Patient denies any family history of mental illness. Patient denies any trauma history.  PHQ 2-9:   Flowsheet Row ED from 02/28/2021 in [facility]  C-SSRS RISK CATEGORY High Risk       Total Time spent with patient: 45 minutes  Musculoskeletal  Strength & Muscle Tone: within normal  limits Gait & Station: normal Patient leans: N/A  Psychiatric Specialty Exam  Presentation General Appearance: Appropriate for Environment  Eye Contact:Good  Speech:Clear and Coherent  Speech Volume:Normal  Handedness:Right   Mood and Affect  Mood:Anxious  Affect:Flat   Thought Process  Thought Processes:Coherent  Descriptions of Associations:Intact  Orientation:Full (Time, Place and Person)  Thought Content:WDL    Hallucinations:Hallucinations: None  Ideas of Reference:None  Suicidal Thoughts:Suicidal Thoughts: No  Homicidal Thoughts:Homicidal Thoughts: No   Sensorium  Memory:Recent Fair; Remote Fair  Judgment:Fair  Insight:Fair   Executive Functions  Concentration:Good  Attention Span:Good  Recall:Good  Fund of Knowledge:Good  Language:Good   Psychomotor Activity  Psychomotor Activity:Psychomotor Activity: Normal   Assets  Assets:Communication Skills; Housing; ; Physical Health; Social 03/02/2021; Resilience   Sleep  Sleep:Sleep: Poor Number of Hours of Sleep: -1   Nutritional Assessment (For OBS and FBC admissions only) Has the patient had a weight loss or gain of 10 pounds or more in the last 3 months?: No Has the patient had a decrease in food intake/or appetite?: No Does the patient have dental problems?: No Does the patient have eating habits or behaviors that may be indicators of an eating disorder including binging or inducing vomiting?: No Has the patient recently lost weight without trying?: No Has the patient been eating poorly because of a decreased appetite?: No Malnutrition Screening Tool Score: 0    Physical Exam HENT:     Head: Normocephalic.     Nose: Nose normal.  Cardiovascular:     Rate and Rhythm: Normal rate.     Pulses: Normal pulses.     Comments: Elevated BP  Musculoskeletal:        General: Normal range of motion.     Cervical back: Normal range of motion.   Neurological:     General: No focal deficit present.     Mental Status: She is alert.    Review of Systems  Constitutional: Negative.   HENT: Negative.   Eyes: Negative.   Respiratory: Negative.   Cardiovascular: Negative.   Gastrointestinal: Negative.   Genitourinary: Negative.   Musculoskeletal: Negative.   Skin: Negative.   Neurological: Negative.   Psychiatric/Behavioral: Negative for hallucinations, substance abuse and suicidal ideas. The patient is nervous/anxious.     Blood pressure (!) 143/100, pulse 65, temperature (!) 97.3 F (36.3 C), temperature source Tympanic, resp. rate 18, SpO2 99 %. There is no height or weight on file to calculate BMI.  Past Psychiatric History: Patient is followed in the outpatient setting by Inis Sizer for anxiety and panic symptoms. Patient denies any history of inpatient treatment.  Is the patient at risk to self? Yes  Has the patient been a risk to self in the past 6 months? No .    Has the patient been a risk to self within the distant past? No   Is the patient a risk to others? No   Has the patient been a risk to others in the past 6 months? No   Has the patient been a risk to others within the distant past? No   Past Medical History:  Past Medical History:  Diagnosis Date  . ADD (attention deficit disorder)    No past surgical history on file.  Family History:  Family History  Problem Relation Age of Onset  . Stroke Mother     Social History:  Social History   Socioeconomic History  . Marital status: Single    Spouse name: Not on file  . Number of children: Not on file  . Years of education: Not on file  . Highest education level: Not on file  Occupational History  . Not on file  Tobacco Use  . Smoking status: Current Every Day Smoker    Packs/day: 0.25    Years: 10.00    Pack years: 2.50    Types: Cigarettes  . Smokeless tobacco: Never Used  Vaping Use  . Vaping Use: Never used  Substance and Sexual  Activity  . Alcohol use: Yes    Comment: occasionally  . Drug use: No  . Sexual activity: Not Currently  Other Topics Concern  . Not on file  Social History Narrative  . Not on file   Social Determinants of Health   Financial Resource Strain: Not on file  Food Insecurity: Not on file  Transportation Needs: Not on file  Physical Activity: Not on file  Stress: Not on file  Social Connections: Not on file  Intimate Partner Violence: Not on file    SDOH:  SDOH Screenings   Alcohol Screen: Not on file  Depression (PHQ2-9): Low Risk   . PHQ-2 Score: 0  Financial Resource Strain: Not on file  Food Insecurity: Not on file  Housing: Not on file  Physical Activity: Not on file  Social Connections: Not on file  Stress: Not on file  Tobacco Use: High Risk  . Smoking Tobacco Use: Current Every Day Smoker  . Smokeless Tobacco Use: Never Used  Transportation Needs: Not on file    Last Labs:  Admission on 02/28/2021, Discharged on 03/01/2021  Component Date Value Ref Range Status  . Sodium  02/28/2021 143  135 - 145 mmol/L Final  . Potassium 02/28/2021 4.0  3.5 - 5.1 mmol/L Final  . Chloride 02/28/2021 106  98 - 111 mmol/L Final  . CO2 02/28/2021 30  22 - 32 mmol/L Final  . Glucose, Bld 02/28/2021 83  70 - 99 mg/dL Final   Glucose reference range applies only to samples taken after fasting for at least 8 hours.  . BUN 02/28/2021 13  6 - 20 mg/dL Final  . Creatinine, Ser 02/28/2021 0.84  0.44 - 1.00 mg/dL Final  . Calcium 55/73/2202 9.9  8.9 - 10.3 mg/dL Final  . Total Protein 02/28/2021 8.4* 6.5 - 8.1 g/dL Final  . Albumin 54/27/0623 4.6  3.5 - 5.0 g/dL Final  . AST 76/28/3151 26  15 - 41 U/L Final  . ALT 02/28/2021 27  0 - 44 U/L Final  . Alkaline Phosphatase 02/28/2021 86  38 - 126 U/L Final  . Total Bilirubin 02/28/2021 0.1* 0.3 - 1.2 mg/dL Final  . GFR, Estimated 02/28/2021 >60  >60 mL/min Final   Comment: (NOTE) Calculated using the CKD-EPI Creatinine Equation (2021)    . Anion gap 02/28/2021 7  5 - 15 Final   Performed at [facility], [address] [place], [place] [address]  . Alcohol, Ethyl (B) 02/28/2021 166* <10 mg/dL Final   Comment: (NOTE) Lowest detectable limit for serum alcohol is 10 mg/dL.  For medical purposes only. Performed at [facility], [address], [place], [place] [address]   . Salicylate Lvl 02/28/2021 <7.0* 7.0 - 30.0 mg/dL Final   Performed at [facility], [address] [place], [place] [address]  . Acetaminophen (Tylenol), Serum 02/28/2021 <10* 10 - 30 ug/mL Final   Comment: (NOTE) Therapeutic concentrations vary significantly. A range of 10-30 ug/mL  may be an effective concentration for many patients. However, some  are best treated at concentrations outside of this range. Acetaminophen concentrations >150 ug/mL at 4 hours after ingestion  and >50 ug/mL at 12 hours after ingestion are often associated with  toxic reactions.  Performed at [facility], [address], [place], [place] [address]   . WBC 02/28/2021 10.6* 4.0 - 10.5 K/uL Final  . RBC 02/28/2021 4.73  3.87 - 5.11 MIL/uL Final  . Hemoglobin 02/28/2021 14.4  12.0 - 15.0 g/dL Final  . HCT 70/35/0093 44.5  36.0 - 46.0 % Final  . MCV 02/28/2021 94.1  80.0 - 100.0 fL Final  . MCH 02/28/2021 30.4  26.0 - 34.0 pg Final  . MCHC 02/28/2021 32.4  30.0 - 36.0 g/dL Final  . RDW 81/82/9937 13.5  11.5 - 15.5 % Final  . Platelets 02/28/2021 389  150 - 400 K/uL Final  . nRBC 02/28/2021 0.0  0.0 - 0.2 % Final   Performed at [facility], [address] [place], [place] [address]  . Opiates 02/28/2021 NONE DETECTED  NONE DETECTED Final  . Cocaine 02/28/2021 POSITIVE* NONE DETECTED Final  . Benzodiazepines 02/28/2021 NONE DETECTED  NONE DETECTED Final  . Amphetamines 02/28/2021 NONE DETECTED  NONE DETECTED Final  . Tetrahydrocannabinol 02/28/2021 NONE DETECTED  NONE  DETECTED Final  . Barbiturates 02/28/2021 NONE DETECTED  NONE DETECTED Final   Comment: (NOTE) DRUG SCREEN FOR MEDICAL PURPOSES ONLY.  IF CONFIRMATION IS NEEDED FOR ANY PURPOSE, NOTIFY LAB WITHIN 5 DAYS.  LOWEST DETECTABLE LIMITS FOR URINE DRUG SCREEN Drug Class  Cutoff (ng/mL) Amphetamine and metabolites    1000 Barbiturate and metabolites    200 Benzodiazepine                 200 Tricyclics and metabolites     300 Opiates and metabolites        300 Cocaine and metabolites        300 THC                            50 Performed at [facility], [address] [place], [place] [address]   . Glucose-Capillary 02/28/2021 83  70 - 99 mg/dL Final   Glucose reference range applies only to samples taken after fasting for at least 8 hours.  . I-stat hCG, quantitative 02/28/2021 <5.0  <5 mIU/mL Final  . Comment 3 02/28/2021          Final   Comment:   GEST. AGE      CONC.  (mIU/mL)   <=1 WEEK        5 - 50     2 WEEKS       50 - 500     3 WEEKS       100 - 10,000     4 WEEKS     1,000 - 30,000        FEMALE AND NON-PREGNANT FEMALE:     LESS THAN 5 mIU/mL   . SARS Coronavirus 2 by RT PCR 02/28/2021 NEGATIVE  NEGATIVE Final   Comment: (NOTE) SARS-CoV-2 target nucleic acids are NOT DETECTED.  The SARS-CoV-2 RNA is generally detectable in upper respiratory specimens during the acute phase of infection. The lowest concentration of SARS-CoV-2 viral copies this assay can detect is 138 copies/mL. A negative result does not preclude SARS-Cov-2 infection and should not be used as the sole basis for treatment or other patient management decisions. A negative result may occur with  improper specimen collection/handling, submission of specimen other than nasopharyngeal swab, presence of viral mutation(s) within the areas targeted by this assay, and inadequate number of viral copies(<138 copies/mL). A negative result must be combined with clinical  observations, patient history, and epidemiological information. The expected result is Negative.  Fact Sheet for Patients:  BloggerCourse.com  Fact Sheet for Healthcare Providers:  SeriousBroker.it  This test is no                          t yet approved or cleared by the [place] FDA and  has been authorized for detection and/or diagnosis of SARS-CoV-2 by FDA under an Emergency Use Authorization (EUA). This EUA will remain  in effect (meaning this test can be used) for the duration of the COVID-19 declaration under Section 564(b)(1) of the Act, 21 U.S.C.section 360bbb-3(b)(1), unless the authorization is terminated  or revoked sooner.      . Influenza A by PCR 02/28/2021 NEGATIVE  NEGATIVE Final  . Influenza B by PCR 02/28/2021 NEGATIVE  NEGATIVE Final   Comment: (NOTE) The Xpert Xpress SARS-CoV-2/FLU/RSV plus assay is intended as an aid in the diagnosis of influenza from Nasopharyngeal swab specimens and should not be used as a sole basis for treatment. Nasal washings and aspirates are unacceptable for Xpert Xpress SARS-CoV-2/FLU/RSV testing.  Fact Sheet for Patients: BloggerCourse.com  Fact Sheet for Healthcare Providers: SeriousBroker.it  This test is not yet approved or cleared by the [place] FDA and has  been authorized for detection and/or diagnosis of SARS-CoV-2 by FDA under an Emergency Use Authorization (EUA). This EUA will remain in effect (meaning this test can be used) for the duration of the COVID-19 declaration under Section 564(b)(1) of the Act, 21 U.S.C. section 360bbb-3(b)(1), unless the authorization is terminated or revoked.  Performed at [facility], [address] [place], [place] [id number]     Allergies: Patient has no known allergies.  PTA Medications: (Not in a hospital admission)   Medical Decision Making   Demetrios Isaacsllison Becker is a 45 year old female IVCed with a history of GAD presenting status post suicide attempt by overdosing on Benadryl. Based on my assessment of patient, TSS assessment and patient presentation and recent over dose on benadryl patient can benefit from being in the [facility] observation unit.    Recommendations  Based on my evaluation the patient does not appear to have an emergency medical condition. Patient has been medically cleared by Arthor CaptainAbigail Harris PA at [facility]. Patient is appropriate for continuous  observation at [facility] for safety and crisis stabilization.   Jasper RilingShalon E Bani Gianfrancesco, NP 03/01/21  1:59 AM

## 2021-03-01 NOTE — Progress Notes (Signed)
Patient now awake, denies SI, HI and AVH. Patient received scheduled medication late due to sleeping. Patient eating sandwich at this time. Patient asked about discharge planning. RN informed patient that the provider has to re-assess before being discharged.

## 2021-03-01 NOTE — Progress Notes (Signed)
Patient resting, eyes closed and respirations are even and unlabored at this time. Staff will continue to monitor.

## 2021-03-01 NOTE — ED Notes (Signed)
Pt in bed 1 is cooperative and complaint with staff request requesting blanket and then went to bed.

## 2021-03-01 NOTE — Discharge Instructions (Signed)
Take all medications as prescribed. Keep all follow-up appointments as scheduled.  Do not consume alcohol or use illegal drugs while on prescription medications. Report any adverse effects from your medications to your primary care provider promptly.  In the event of recurrent symptoms or worsening symptoms, call 911, a crisis hotline, or go to the nearest emergency department for evaluation.   

## 2021-03-01 NOTE — ED Notes (Signed)
Patient cooperative with skin assessment.  Ambulated independently onto unit.  Denied SI, HI, AVH.  Offered food and fluids.

## 2021-03-14 ENCOUNTER — Other Ambulatory Visit: Payer: Self-pay

## 2021-03-14 ENCOUNTER — Emergency Department (HOSPITAL_COMMUNITY)
Admission: EM | Admit: 2021-03-14 | Discharge: 2021-03-15 | Disposition: A | Discharge status: Home / Self Care | Payer: Medicaid Other | Attending: Emergency Medicine | Admitting: Emergency Medicine

## 2021-03-14 ENCOUNTER — Encounter (HOSPITAL_COMMUNITY): Payer: Self-pay | Admitting: *Deleted

## 2021-03-14 DIAGNOSIS — R109 Unspecified abdominal pain: Secondary | ICD-10-CM | POA: Diagnosis present

## 2021-03-14 DIAGNOSIS — Z5321 Procedure and treatment not carried out due to patient leaving prior to being seen by health care provider: Secondary | ICD-10-CM | POA: Insufficient documentation

## 2021-03-14 DIAGNOSIS — R110 Nausea: Secondary | ICD-10-CM | POA: Insufficient documentation

## 2021-03-14 LAB — CBC WITH DIFFERENTIAL/PLATELET
Abs Immature Granulocytes: 0.06 10*3/uL (ref 0.00–0.07)
Basophils Absolute: 0 10*3/uL (ref 0.0–0.1)
Basophils Relative: 0 %
Eosinophils Absolute: 0.2 10*3/uL (ref 0.0–0.5)
Eosinophils Relative: 2 %
HCT: 42.4 % (ref 36.0–46.0)
Hemoglobin: 13.5 g/dL (ref 12.0–15.0)
Immature Granulocytes: 1 %
Lymphocytes Relative: 16 %
Lymphs Abs: 1.8 10*3/uL (ref 0.7–4.0)
MCH: 30.2 pg (ref 26.0–34.0)
MCHC: 31.8 g/dL (ref 30.0–36.0)
MCV: 94.9 fL (ref 80.0–100.0)
Monocytes Absolute: 1.2 10*3/uL — ABNORMAL HIGH (ref 0.1–1.0)
Monocytes Relative: 10 %
Neutro Abs: 8.3 10*3/uL — ABNORMAL HIGH (ref 1.7–7.7)
Neutrophils Relative %: 71 %
Platelets: 364 10*3/uL (ref 150–400)
RBC: 4.47 MIL/uL (ref 3.87–5.11)
RDW: 13.2 % (ref 11.5–15.5)
WBC: 11.6 10*3/uL — ABNORMAL HIGH (ref 4.0–10.5)
nRBC: 0 % (ref 0.0–0.2)

## 2021-03-14 MED ORDER — ONDANSETRON 4 MG PO TBDP
4.0000 mg | ORAL_TABLET | Freq: Once | ORAL | Status: AC
  Administered 2021-03-14: 4 mg via ORAL
  Filled 2021-03-14: qty 1

## 2021-03-14 NOTE — ED Triage Notes (Signed)
Pt c/o abd pain and nausea tonight   lmp  birth control

## 2021-03-14 NOTE — ED Provider Notes (Signed)
MSE was initiated and I personally evaluated the patient and placed orders (if any) at  10:58 PM on March 14, 2021.  Patient with abdominal pain x 3-4 days, intermittent at first and constant today. Associated with N, V. No diarrhea or constipation, last BM about 3 hours ago that did not change the character of the pain. She reports fever earlier in symptom onset. C-section in the past, no other surgeries. No chest pain, SOB. No urinary symptoms, no vaginal discharge.   Abd soft, BS present, nondistended Tender in periumbilical area  The patient appears stable so that the remainder of the MSE may be completed by another provider.   Elpidio Anis, PA-C 03/14/21 [id number]    Gwyneth Sprout, MD 03/16/21 1601

## 2021-03-15 LAB — COMPREHENSIVE METABOLIC PANEL
ALT: 22 U/L (ref 0–44)
AST: 19 U/L (ref 15–41)
Albumin: 3.7 g/dL (ref 3.5–5.0)
Alkaline Phosphatase: 78 U/L (ref 38–126)
Anion gap: 10 (ref 5–15)
BUN: 12 mg/dL (ref 6–20)
CO2: 23 mmol/L (ref 22–32)
Calcium: 9 mg/dL (ref 8.9–10.3)
Chloride: 101 mmol/L (ref 98–111)
Creatinine, Ser: 0.66 mg/dL (ref 0.44–1.00)
GFR, Estimated: >60 mL/min (ref >60)
Glucose, Bld: 106 mg/dL — ABNORMAL HIGH (ref 70–99)
Potassium: 4.1 mmol/L (ref 3.5–5.1)
Sodium: 134 mmol/L — ABNORMAL LOW (ref 135–145)
Total Bilirubin: 0.2 mg/dL — ABNORMAL LOW (ref 0.3–1.2)
Total Protein: 6.9 g/dL (ref 6.5–8.1)

## 2021-03-15 LAB — LIPASE, BLOOD: Lipase: 25 U/L (ref 11–51)

## 2021-03-15 NOTE — ED Notes (Signed)
Patient did not answer when called recheck vitalsigns

## 2021-06-10 ENCOUNTER — Telehealth (INDEPENDENT_AMBULATORY_CARE_PROVIDER_SITE_OTHER): Payer: Medicaid Other | Admitting: Registered Nurse

## 2021-06-10 ENCOUNTER — Other Ambulatory Visit: Payer: Self-pay

## 2021-06-10 ENCOUNTER — Encounter: Payer: Self-pay | Admitting: Registered Nurse

## 2021-06-10 DIAGNOSIS — F909 Attention-deficit hyperactivity disorder, unspecified type: Secondary | ICD-10-CM

## 2021-06-10 DIAGNOSIS — F411 Generalized anxiety disorder: Secondary | ICD-10-CM

## 2021-06-10 MED ORDER — AMPHETAMINE-DEXTROAMPHETAMINE 20 MG PO TABS: 20.0000 mg | ORAL_TABLET | Freq: Two times a day (BID) | ORAL | 0 refills | Status: DC

## 2021-06-10 MED ORDER — AMPHETAMINE-DEXTROAMPHET ER 30 MG PO CP24: 30.0000 mg | ORAL_CAPSULE | ORAL | 0 refills | Status: DC

## 2021-06-10 MED ORDER — VENLAFAXINE HCL ER 150 MG PO CP24: 150.0000 mg | ORAL_CAPSULE | Freq: Every day | ORAL | 3 refills | Status: DC

## 2021-06-10 NOTE — Progress Notes (Signed)
Telemedicine Encounter- SOAP NOTE Established Patient  This telephone encounter was conducted with the patient's (or proxy's) verbal consent via audio telecommunications: yes/no: Yes Patient was instructed to have this encounter in a suitably private space; and to only have persons present to whom they give permission to participate. In addition, patient identity was confirmed by use of name plus two identifiers (DOB and address).  I discussed the limitations, risks, security and privacy concerns of performing an evaluation and management service by telephone and the availability of in person appointments. I also discussed with the patient that there may be a patient responsible charge related to this service. The patient expressed understanding and agreed to proceed.  I spent a total of 16 minutes talking with the patient or their proxy.  Patient at home Provider in office  Participants: Jari Sportsman, NP and Raquel James  No chief complaint on file.   Subjective   Crystal Webb is a 45 y.o. established patient. Telephone visit today for med recheck  HPI ADHD Adderall 30mg  ER po qd, 15mg  po bid.  Limited effect. Has been on 30mg  ER po qd and 20mg  po bid with better effect, no AE. Wants to increase dose again Acknowledges that she does not use 20mg  bid every day, usually halves or skips doses on days off.   Anxiety Effexor 150mg  XR po qd Good effect, no AE, hopes to continue  Otherwise no concerns  HM: Notes upto date on pap and mammo Est with [facility], will schedule colonoscopy Has had covid x2, no booster. Will add to MyChart.  Plans to get flu shot soon.   Patient Active Problem List   Diagnosis Date Noted   Anxiety state 01/17/2019   Attention deficit hyperactivity disorder (ADHD) 01/17/2019    Past Medical History:  Diagnosis Date   ADD (attention deficit disorder)     Current Outpatient Medications  Medication Sig Dispense Refill    amphetamine-dextroamphetamine (ADDERALL) 20 MG tablet Take 1 tablet (20 mg total) by mouth 2 (two) times daily. 180 tablet 0   amphetamine-dextroamphetamine (ADDERALL XR) 30 MG 24 hr capsule Take 1 capsule (30 mg total) by mouth every morning. 90 capsule 0   venlafaxine XR (EFFEXOR-XR) 150 MG 24 hr capsule Take 1 capsule (150 mg total) by mouth daily with breakfast. 90 capsule 3   No current facility-administered medications for this visit.    No Known Allergies  Social History   Socioeconomic History   Marital status: Single    Spouse name: Not on file   Number of children: Not on file   Years of education: Not on file   Highest education level: Not on file  Occupational History   Not on file  Tobacco Use   Smoking status: Every Day    Packs/day: 0.25    Years: 10.00    Pack years: 2.50    Types: Cigarettes   Smokeless tobacco: Never  Vaping Use   Vaping Use: Never used  Substance and Sexual Activity   Alcohol use: Yes    Comment: occasionally   Drug use: No   Sexual activity: Not Currently  Other Topics Concern   Not on file  Social History Narrative   Not on file   Social Determinants of Health   Financial Resource Strain: Not on file  Food Insecurity: Not on file  Transportation Needs: Not on file  Physical Activity: Not on file  Stress: Not on file  Social Connections: Not on file  Intimate  Partner Violence: Not on file    Review of Systems  Constitutional: Negative.   HENT: Negative.    Eyes: Negative.   Respiratory: Negative.    Cardiovascular: Negative.   Gastrointestinal: Negative.   Genitourinary: Negative.   Musculoskeletal: Negative.   Skin: Negative.   Neurological: Negative.   Endo/Heme/Allergies: Negative.   Psychiatric/Behavioral: Negative.    All other systems reviewed and are negative.  Objective   Vitals as reported by the patient: There were no vitals filed for this visit.  Diagnoses and all orders for this visit:  Anxiety  state -     venlafaxine XR (EFFEXOR-XR) 150 MG 24 hr capsule; Take 1 capsule (150 mg total) by mouth daily with breakfast.  Attention deficit hyperactivity disorder (ADHD), unspecified ADHD type -     amphetamine-dextroamphetamine (ADDERALL XR) 30 MG 24 hr capsule; Take 1 capsule (30 mg total) by mouth every morning. -     amphetamine-dextroamphetamine (ADDERALL) 20 MG tablet; Take 1 tablet (20 mg total) by mouth 2 (two) times daily.   PLAN Increase adderall as above Maintain effexor Discussed health maintenance, she will update via MyChart and have records faxed Follow up in 3 mo for med check Patient encouraged to call clinic with any questions, comments, or concerns.  I discussed the assessment and treatment plan with the patient. The patient was provided an opportunity to ask questions and all were answered. The patient agreed with the plan and demonstrated an understanding of the instructions.   The patient was advised to call back or seek an in-person evaluation if the symptoms worsen or if the condition fails to improve as anticipated.  I provided 16 minutes of non-face-to-face time during this encounter.  [name], NP  Primary Care at [facility]

## 2021-07-07 ENCOUNTER — Telehealth: Payer: Self-pay

## 2021-07-07 DIAGNOSIS — F909 Attention-deficit hyperactivity disorder, unspecified type: Secondary | ICD-10-CM

## 2021-07-07 NOTE — Telephone Encounter (Signed)
Patient called and stated that her insurance will not cover her Adderall Rx for 90 day supply. Must be written for 30 days.

## 2021-07-07 NOTE — Telephone Encounter (Signed)
Pt RX for amphetamine-dextroamphetamine (ADDERALL XR) 30 MG 24 hr capsule [[id number]] , amphetamine-dextroamphetamine (ADDERALL) 20 MG tablet [[id number]]   Insurance will not cover 90 days and needs they wrote to only 30 day supply sent   [id number] - [place], [place] - [address]   Pt  call back [phone]

## 2021-07-08 MED ORDER — AMPHETAMINE-DEXTROAMPHET ER 30 MG PO CP24: 30.0000 mg | ORAL_CAPSULE | ORAL | 0 refills | Status: DC

## 2021-07-08 NOTE — Telephone Encounter (Signed)
This message has been already routed to the provider within the last 24 hours please hold and allow processing by the provider

## 2021-07-08 NOTE — Telephone Encounter (Signed)
Patient called in once again, requesting to speak with Kateri Plummer, advised that he was seeing patients. Crystal Webb states she is completely out of medication and has been for 48 hours and is wanting this prescription sent in today. Advised the normal turn around time is 48-72 hours for refills. I did advise patient that in the future to try to call when she has at least 2-3 pills left, verbalized understanding and states she will call back in about an hour to check the process and ended the call.

## 2021-07-08 NOTE — Telephone Encounter (Signed)
Patient is calling in stating the prescription was written for 90 days, needs to be a 30 day quantity.

## 2021-07-09 ENCOUNTER — Other Ambulatory Visit: Payer: Self-pay | Admitting: Registered Nurse

## 2021-07-09 DIAGNOSIS — F909 Attention-deficit hyperactivity disorder, unspecified type: Secondary | ICD-10-CM

## 2021-07-09 MED ORDER — AMPHETAMINE-DEXTROAMPHETAMINE 20 MG PO TABS: 20.0000 mg | ORAL_TABLET | Freq: Two times a day (BID) | ORAL | 0 refills | Status: DC

## 2021-07-09 MED ORDER — AMPHETAMINE-DEXTROAMPHET ER 30 MG PO CP24: 30.0000 mg | ORAL_CAPSULE | ORAL | 0 refills | Status: DC

## 2021-07-09 NOTE — Telephone Encounter (Signed)
Patient called once again and states that she needs her 20 mg. Adderall rx sent in.  She would like everything sent to [facility].

## 2021-08-04 ENCOUNTER — Telehealth: Payer: Self-pay | Admitting: Registered Nurse

## 2021-08-04 NOTE — Telephone Encounter (Signed)
..  Caller name:Kimley Diem Dicocco callback #: [phone]  Encourage patient to contact the pharmacy for refills or they can request refills through [facility]  (Please schedule appointment if patient has not been seen in over a year)  MEDICATION NAME & DOSE:  Adderall xr     and Adderall 20 mg.  Notes/Comments from patient: Patient only received 20 of her medication,(adderall XR)  the other 10 need to be called in.  WHAT PHARMACY WOULD THEY LIKE THIS SENT TO: [facility] on [address]  Please notify patient: It takes 48-72 hours to process rx refill requests Ask patient to call pharmacy to ensure rx is ready before heading there.   (CLINICAL TO FILL OR ROUTE PER PROTOCOLS)

## 2021-08-04 NOTE — Telephone Encounter (Signed)
LM for patient. 90 day supply sent in for patient on 07/09/21. Should be able to get from pharmacy

## 2021-08-13 ENCOUNTER — Telehealth: Payer: Self-pay | Admitting: Registered Nurse

## 2021-08-13 NOTE — Telephone Encounter (Signed)
..  Caller name:  Eyana Stolze  Caller callback [phone]  Encourage patient to contact the pharmacy for refills or they can request refills through [facility]  (Please schedule appointment if patient has not been seen in over a year)  MEDICATION NAME & DOSE:  Adderall XR  Notes/Comments from patient:  Patient needs it called in for 10 more.  They only had 20 when patient picked it up. WHAT PHARMACY WOULD THEY LIKE THIS SENT TO: [facility] on [address], [place]  Please notify patient: It takes 48-72 hours to process rx refill requests Ask patient to call pharmacy to ensure rx is ready before heading there.   (CLINICAL TO FILL OR ROUTE PER PROTOCOLS)

## 2021-08-14 ENCOUNTER — Other Ambulatory Visit: Payer: Self-pay | Admitting: Registered Nurse

## 2021-08-14 DIAGNOSIS — F909 Attention-deficit hyperactivity disorder, unspecified type: Secondary | ICD-10-CM

## 2021-08-14 MED ORDER — AMPHETAMINE-DEXTROAMPHET ER 30 MG PO CP24: 30.0000 mg | ORAL_CAPSULE | ORAL | 0 refills | Status: DC

## 2021-08-14 NOTE — Telephone Encounter (Signed)
Sent Thanks Rich

## 2021-09-01 ENCOUNTER — Telehealth: Payer: Self-pay | Admitting: Registered Nurse

## 2021-09-01 NOTE — Telephone Encounter (Signed)
Pt called in asking for  refills on the Adderral 20 mg, she will also be needing a refill on the adderral 30mg  after 09/13/21 pt uses [facility] on [address]

## 2021-09-03 ENCOUNTER — Other Ambulatory Visit: Payer: Self-pay | Admitting: Registered Nurse

## 2021-09-03 DIAGNOSIS — F909 Attention-deficit hyperactivity disorder, unspecified type: Secondary | ICD-10-CM

## 2021-09-03 MED ORDER — AMPHETAMINE-DEXTROAMPHETAMINE 20 MG PO TABS: 20.0000 mg | ORAL_TABLET | Freq: Two times a day (BID) | ORAL | 0 refills | Status: DC

## 2021-09-03 MED ORDER — AMPHETAMINE-DEXTROAMPHET ER 30 MG PO CP24: 30.0000 mg | ORAL_CAPSULE | ORAL | 0 refills | Status: DC

## 2021-09-03 NOTE — Telephone Encounter (Signed)
Lvm for patient making her aware of the refill as well as needing to call the office to schedule an appt

## 2021-09-03 NOTE — Telephone Encounter (Signed)
3 mo refill sent. Pt will be due for med check at that time  Thanks,  Luan Pulling

## 2021-10-02 ENCOUNTER — Other Ambulatory Visit: Payer: Self-pay

## 2021-10-02 DIAGNOSIS — F909 Attention-deficit hyperactivity disorder, unspecified type: Secondary | ICD-10-CM

## 2021-10-02 MED ORDER — AMPHETAMINE-DEXTROAMPHETAMINE 20 MG PO TABS: 20.0000 mg | ORAL_TABLET | Freq: Two times a day (BID) | ORAL | 0 refills | Status: DC

## 2021-10-02 MED ORDER — AMPHETAMINE-DEXTROAMPHET ER 30 MG PO CP24: 30.0000 mg | ORAL_CAPSULE | ORAL | 0 refills | Status: DC

## 2021-10-02 NOTE — Telephone Encounter (Signed)
Caller name:Romey Doctor, hospital callback [phone]  Encourage patient to contact the pharmacy for refills or they can request refills through [facility]  (Please schedule appointment if patient has not been seen in over a year)  MEDICATION NAME & DOSE:amphetamine-dextroamphetamine (ADDERALL XR) 30 MG 24 hr capsule [[id number]] , amphetamine-dextroamphetamine (ADDERALL) 20 MG tablet   Notes/Comments from patient:  WHAT PHARMACY WOULD THEY LIKE THIS SENT TO: [id number] - , [place] - [address] AT [facility] OF [address]   Please notify patient: It takes 48-72 hours to process rx refill requests Ask patient to call pharmacy to ensure rx is ready before heading there.   (CLINICAL TO FILL OR ROUTE PER PROTOCOLS)

## 2021-10-02 NOTE — Telephone Encounter (Signed)
Patient is requesting a refill of the following medications: Requested Prescriptions   Pending Prescriptions Disp Refills   amphetamine-dextroamphetamine (ADDERALL) 20 MG tablet 180 tablet 0    Sig: Take 1 tablet (20 mg total) by mouth 2 (two) times daily.   amphetamine-dextroamphetamine (ADDERALL XR) 30 MG 24 hr capsule 90 capsule 0    Sig: Take 1 capsule (30 mg total) by mouth every morning.    Date of patient request: 10/02/2021 Last office visit: 06/10/2021 Date of last refill: 09/03/2021 Last refill amount: 90 tablets  Follow up time period per chart: n/a

## 2021-11-04 ENCOUNTER — Other Ambulatory Visit: Payer: Self-pay | Admitting: Registered Nurse

## 2021-11-04 DIAGNOSIS — F909 Attention-deficit hyperactivity disorder, unspecified type: Secondary | ICD-10-CM

## 2021-11-04 MED ORDER — AMPHETAMINE-DEXTROAMPHETAMINE 20 MG PO TABS: 20.0000 mg | ORAL_TABLET | Freq: Two times a day (BID) | ORAL | 0 refills | Status: DC

## 2021-11-04 MED ORDER — AMPHETAMINE-DEXTROAMPHET ER 30 MG PO CP24: 30.0000 mg | ORAL_CAPSULE | ORAL | 0 refills | Status: DC

## 2021-11-04 NOTE — Telephone Encounter (Signed)
[facility] called and said they can not fill this RX

## 2021-11-04 NOTE — Telephone Encounter (Signed)
error 

## 2021-11-04 NOTE — Telephone Encounter (Signed)
Patient is requesting a refill of the following medications: Requested Prescriptions   Pending Prescriptions Disp Refills   amphetamine-dextroamphetamine (ADDERALL XR) 30 MG 24 hr capsule 90 capsule 0    Sig: Take 1 capsule (30 mg total) by mouth every morning.   amphetamine-dextroamphetamine (ADDERALL) 20 MG tablet 180 tablet 0    Sig: Take 1 tablet (20 mg total) by mouth 2 (two) times daily.    Date of patient request: 11/04/21 Last office visit: 06/10/21 Date of last refill: 10/02/21 Last refill amount: 90, 180

## 2021-11-04 NOTE — Telephone Encounter (Signed)
Pt called in asking for a refill on the Adderall 20 mg and Adderall 30 mg XR. Pt uses [facility] on [address]   Please advise   Last appt 06/10/21 last refill date 10/02/21

## 2021-11-04 NOTE — Telephone Encounter (Signed)
Pt is ok with the medication [facility]

## 2021-11-04 NOTE — Telephone Encounter (Signed)
Pt would like all her scripts to [place] [facility] please advise

## 2021-11-05 MED ORDER — AMPHETAMINE-DEXTROAMPHET ER 30 MG PO CP24: 30.0000 mg | ORAL_CAPSULE | ORAL | 0 refills | Status: DC

## 2021-11-05 MED ORDER — AMPHETAMINE-DEXTROAMPHETAMINE 20 MG PO TABS: 20.0000 mg | ORAL_TABLET | Freq: Two times a day (BID) | ORAL | 0 refills | Status: DC

## 2021-11-05 NOTE — Telephone Encounter (Signed)
Pt wants [facility]

## 2021-11-05 NOTE — Addendum Note (Signed)
Addended by: Eldred Manges on: 11/05/2021 09:02 AM   Modules accepted: Orders

## 2021-11-11 ENCOUNTER — Telehealth: Payer: Self-pay | Admitting: Registered Nurse

## 2021-11-11 NOTE — Telephone Encounter (Signed)
Pt called in stating that her insurance doesn't cover a 90 day supply, she has to have a 30 day supply. Can we resend in the Adderral 20MG  in for the pt. She is out of this medicaiton.   [facility] [address]

## 2021-11-11 NOTE — Telephone Encounter (Signed)
Called pharmacy and edited rx to be 30 day supply rather than 90, pt wanted it noted that she cannot get 90 day supply due to insurance not covering and would like future refills to be sent as 30 day supplies FYI

## 2021-11-17 ENCOUNTER — Other Ambulatory Visit: Payer: Self-pay | Admitting: Registered Nurse

## 2021-11-17 DIAGNOSIS — F411 Generalized anxiety disorder: Secondary | ICD-10-CM

## 2021-11-17 NOTE — Telephone Encounter (Signed)
Noted  Thanks,  Rich

## 2021-12-03 ENCOUNTER — Other Ambulatory Visit: Payer: Self-pay

## 2021-12-03 DIAGNOSIS — F909 Attention-deficit hyperactivity disorder, unspecified type: Secondary | ICD-10-CM

## 2021-12-03 NOTE — Telephone Encounter (Signed)
Encourage patient to contact the pharmacy for refills or they can request refills through [facility] ? ?(Please schedule appointment if patient has not been seen in over a year) ? ? ? ?WHAT PHARMACY WOULD THEY LIKE THIS SENT TO: [id number] - [place], [address] AT [address]  ? ?MEDICATION NAME & DOSE:amphetamine-dextroamphetamine (ADDERALL) 20 MG tablet  ? ?NOTES/COMMENTS FROM PATIENT:Note patient says insurance will only take one month at a time  ? ? ? ? ? ?[place] office please notify patient: ?It takes 48-72 hours to process rx refill requests ?Ask patient to call pharmacy to ensure rx is ready before heading there.  ? ?

## 2021-12-04 MED ORDER — AMPHETAMINE-DEXTROAMPHETAMINE 20 MG PO TABS: 20.0000 mg | ORAL_TABLET | Freq: Two times a day (BID) | ORAL | 0 refills | Status: DC

## 2021-12-04 NOTE — Telephone Encounter (Signed)
Patient is requesting a refill of the following medications: ?Requested Prescriptions  ? ?Pending Prescriptions Disp Refills  ? amphetamine-dextroamphetamine (ADDERALL) 20 MG tablet 180 tablet 0  ?  Sig: Take 1 tablet (20 mg total) by mouth 2 (two) times daily.  ? ? ?Date of patient request: 12/03/2021 ?Last office visit: 06/10/2021 ?Date of last refill: 11/05/2021 ?Last refill amount: 180 tablets  ?Follow up time period per chart: none ? ?

## 2021-12-08 ENCOUNTER — Other Ambulatory Visit: Payer: Self-pay | Admitting: Registered Nurse

## 2021-12-08 ENCOUNTER — Telehealth: Payer: Self-pay | Admitting: Registered Nurse

## 2021-12-08 DIAGNOSIS — F909 Attention-deficit hyperactivity disorder, unspecified type: Secondary | ICD-10-CM

## 2021-12-08 MED ORDER — AMPHETAMINE-DEXTROAMPHETAMINE 20 MG PO TABS: 20.0000 mg | ORAL_TABLET | Freq: Two times a day (BID) | ORAL | 0 refills | Status: DC

## 2021-12-08 NOTE — Telephone Encounter (Signed)
Pt called stating that Crystal Webb has ordered her adderall 20mg  for 3 months, but her insurance is not going to cover that. Her insurance can only cover for a month. So she would like it to be changed to a month. ? ?Please advice ? ?Thank You ?

## 2021-12-08 NOTE — Telephone Encounter (Signed)
Done!  Thanks,  Rich

## 2022-01-08 ENCOUNTER — Telehealth: Payer: Self-pay

## 2022-01-08 NOTE — Telephone Encounter (Signed)
MEDICATION: amphetamine-dextroamphetamine (ADDERALL XR) 30 MG 24 hr capsule // amphetamine-dextroamphetamine (ADDERALL) 20 MG tablet ? ?PHARMACY: [id number] - [place], [place] - [address] AT [facility] OF [address]  ? ?Comments: Patient is completely out. Did advise the turn around time, and that we are closed tomorrow.  ? ?**Let patient know to contact pharmacy at the end of the day to make sure medication is ready. ** ? ?** Please notify patient to allow 48-72 hours to process** ? ?**Encourage patient to contact the pharmacy for refills or they can request refills through [facility]** ? ? ?

## 2022-01-09 ENCOUNTER — Other Ambulatory Visit: Payer: Self-pay

## 2022-01-09 DIAGNOSIS — F909 Attention-deficit hyperactivity disorder, unspecified type: Secondary | ICD-10-CM

## 2022-01-09 NOTE — Progress Notes (Deleted)
Patient is requesting a refill of the following medications: ?Requested Prescriptions  ? ?Pending Prescriptions Disp Refills  ? amphetamine-dextroamphetamine (ADDERALL) 20 MG tablet 60 tablet 0  ?  Sig: Take 1 tablet (20 mg total) by mouth 2 (two) times daily.  ? amphetamine-dextroamphetamine (ADDERALL XR) 30 MG 24 hr capsule 90 capsule 0  ?  Sig: Take 1 capsule (30 mg total) by mouth every morning.  ? ? ?Date of patient request: 01/09/22 ?Last office visit: 06/10/21 ?Date of last refill: 12/08/21 ?Last refill amount: 60 ?Follow up time period per chart: non scheduled  ? ?

## 2022-01-09 NOTE — Telephone Encounter (Signed)
Patient is requesting a refill of the following medications: ?Requested Prescriptions  ? ?Pending Prescriptions Disp Refills  ? amphetamine-dextroamphetamine (ADDERALL) 20 MG tablet 60 tablet 0  ?  Sig: Take 1 tablet (20 mg total) by mouth 2 (two) times daily.  ? amphetamine-dextroamphetamine (ADDERALL XR) 30 MG 24 hr capsule 90 capsule 0  ?  Sig: Take 1 capsule (30 mg total) by mouth every morning.  ? ? ?Date of patient request: 01/09/22 ?Last office visit: 06/10/21 ?Date of last refill: 12/08/21 ?Last refill amount: 60 ?Follow up time period per chart: non scheduled  ? ?

## 2022-01-13 MED ORDER — AMPHETAMINE-DEXTROAMPHET ER 30 MG PO CP24: 30.0000 mg | ORAL_CAPSULE | ORAL | 0 refills | Status: DC

## 2022-01-13 MED ORDER — AMPHETAMINE-DEXTROAMPHETAMINE 20 MG PO TABS: 20.0000 mg | ORAL_TABLET | Freq: Two times a day (BID) | ORAL | 0 refills | Status: DC

## 2022-02-09 ENCOUNTER — Other Ambulatory Visit: Payer: Self-pay

## 2022-02-09 DIAGNOSIS — F909 Attention-deficit hyperactivity disorder, unspecified type: Secondary | ICD-10-CM

## 2022-02-09 NOTE — Telephone Encounter (Signed)
MEDICATION: amphetamine-dextroamphetamine (ADDERALL) 20 MG tablet // amphetamine-dextroamphetamine (ADDERALL XR) 30 MG 24 hr capsule ? ?PHARMACY:[id number] - [place], [place] - [address] AT [facility] OF [address] ? ?Comments: Patient is out  ? ?**Let patient know to contact pharmacy at the end of the day to make sure medication is ready. ** ? ?** Please notify patient to allow 48-72 hours to process** ? ?**Encourage patient to contact the pharmacy for refills or they can request refills through [facility]** ?  ?

## 2022-02-10 ENCOUNTER — Encounter: Payer: Self-pay | Admitting: Registered Nurse

## 2022-02-10 ENCOUNTER — Telehealth (INDEPENDENT_AMBULATORY_CARE_PROVIDER_SITE_OTHER): Payer: Medicaid Other | Admitting: Registered Nurse

## 2022-02-10 DIAGNOSIS — F411 Generalized anxiety disorder: Secondary | ICD-10-CM

## 2022-02-10 DIAGNOSIS — F909 Attention-deficit hyperactivity disorder, unspecified type: Secondary | ICD-10-CM | POA: Diagnosis not present

## 2022-02-10 MED ORDER — VENLAFAXINE HCL ER 150 MG PO CP24: 150.0000 mg | ORAL_CAPSULE | Freq: Every day | ORAL | 3 refills | Status: AC

## 2022-02-10 MED ORDER — AMPHETAMINE-DEXTROAMPHET ER 30 MG PO CP24: 30.0000 mg | ORAL_CAPSULE | ORAL | 0 refills | Status: DC

## 2022-02-10 MED ORDER — AMPHETAMINE-DEXTROAMPHETAMINE 20 MG PO TABS: 20.0000 mg | ORAL_TABLET | Freq: Two times a day (BID) | ORAL | 0 refills | Status: DC

## 2022-02-10 NOTE — Progress Notes (Signed)
Telemedicine Encounter- SOAP NOTE Established Patient  This telephone encounter was conducted with the patient's (or proxy's) verbal consent via audio telecommunications: yes/no: Yes Patient was instructed to have this encounter in a suitably private space; and to only have persons present to whom they give permission to participate. In addition, patient identity was confirmed by use of name plus two identifiers (DOB and address).  I discussed the limitations, risks, security and privacy concerns of performing an evaluation and management service by telephone and the availability of in person appointments. I also discussed with the patient that there may be a patient responsible charge related to this service. The patient expressed understanding and agreed to proceed.  I spent a total of 17 minutes talking with the patient or their proxy.  Patient at home Provider in office  Participants: Kathrin Ruddy, NP and Charna Busman  Chief Complaint  Patient presents with   Medication Refill    Pt is f/u on Adderall 20 mg  States she is doing well on the medication     Subjective   Crystal Webb is a 46 y.o. established patient. Telephone visit today for  HPI   Patient Active Problem List   Diagnosis Date Noted   Anxiety state 01/17/2019   Attention deficit hyperactivity disorder (ADHD) 01/17/2019    Past Medical History:  Diagnosis Date   ADD (attention deficit disorder)     Current Outpatient Medications  Medication Sig Dispense Refill   [START ON 03/12/2022] amphetamine-dextroamphetamine (ADDERALL XR) 30 MG 24 hr capsule Take 1 capsule (30 mg total) by mouth every morning. 30 capsule 0   [START ON 03/12/2022] amphetamine-dextroamphetamine (ADDERALL) 20 MG tablet Take 1 tablet (20 mg total) by mouth 2 (two) times daily. 60 tablet 0   amphetamine-dextroamphetamine (ADDERALL XR) 30 MG 24 hr capsule Take 1 capsule (30 mg total) by mouth every morning. 30 capsule 0    amphetamine-dextroamphetamine (ADDERALL) 20 MG tablet Take 1 tablet (20 mg total) by mouth 2 (two) times daily. 60 tablet 0   venlafaxine XR (EFFEXOR-XR) 150 MG 24 hr capsule Take 1 capsule (150 mg total) by mouth daily with breakfast. 90 capsule 3   No current facility-administered medications for this visit.    No Known Allergies  Social History   Socioeconomic History   Marital status: Single    Spouse name: Not on file   Number of children: Not on file   Years of education: Not on file   Highest education level: Not on file  Occupational History   Not on file  Tobacco Use   Smoking status: Every Day    Packs/day: 0.25    Years: 10.00    Pack years: 2.50    Types: Cigarettes   Smokeless tobacco: Never  Vaping Use   Vaping Use: Never used  Substance and Sexual Activity   Alcohol use: Yes    Comment: occasionally   Drug use: No   Sexual activity: Not Currently  Other Topics Concern   Not on file  Social History Narrative   Not on file   Social Determinants of Health   Financial Resource Strain: Not on file  Food Insecurity: Not on file  Transportation Needs: Not on file  Physical Activity: Not on file  Stress: Not on file  Social Connections: Not on file  Intimate Partner Violence: Not on file    Review of Systems  Constitutional: Negative.   HENT: Negative.    Eyes: Negative.   Respiratory: Negative.  Cardiovascular: Negative.   Gastrointestinal: Negative.   Genitourinary: Negative.   Musculoskeletal: Negative.   Skin: Negative.   Neurological: Negative.   Endo/Heme/Allergies: Negative.   Psychiatric/Behavioral: Negative.    All other systems reviewed and are negative.  Objective   Vitals as reported by the patient: There were no vitals filed for this visit.  Crystal Webb was seen today for medication refill.  Diagnoses and all orders for this visit:  Anxiety state -     venlafaxine XR (EFFEXOR-XR) 150 MG 24 hr capsule; Take 1 capsule (150 mg  total) by mouth daily with breakfast.  Attention deficit hyperactivity disorder (ADHD), unspecified ADHD type -     amphetamine-dextroamphetamine (ADDERALL XR) 30 MG 24 hr capsule; Take 1 capsule (30 mg total) by mouth every morning. -     amphetamine-dextroamphetamine (ADDERALL) 20 MG tablet; Take 1 tablet (20 mg total) by mouth 2 (two) times daily. -     amphetamine-dextroamphetamine (ADDERALL XR) 30 MG 24 hr capsule; Take 1 capsule (30 mg total) by mouth every morning. -     amphetamine-dextroamphetamine (ADDERALL) 20 MG tablet; Take 1 tablet (20 mg total) by mouth 2 (two) times daily.   PLAN Refills x 6 mo Advise pt to present to office for cpe and labs Patient encouraged to call clinic with any questions, comments, or concerns.    I discussed the assessment and treatment plan with the patient. The patient was provided an opportunity to ask questions and all were answered. The patient agreed with the plan and demonstrated an understanding of the instructions.   The patient was advised to call back or seek an in-person evaluation if the symptoms worsen or if the condition fails to improve as anticipated.  I provided 17 minutes of non-face-to-face time during this encounter.  Maximiano Coss, NP

## 2022-02-10 NOTE — Telephone Encounter (Signed)
Patient is requesting a refill of the following medications: ?Requested Prescriptions  ? ?Pending Prescriptions Disp Refills  ? amphetamine-dextroamphetamine (ADDERALL XR) 30 MG 24 hr capsule 90 capsule 0  ?  Sig: Take 1 capsule (30 mg total) by mouth every morning.  ? amphetamine-dextroamphetamine (ADDERALL) 20 MG tablet 60 tablet 0  ?  Sig: Take 1 tablet (20 mg total) by mouth 2 (two) times daily.  ? ? ?Date of patient request: 02/10/22 ?Last office visit: 06/10/21 ?Date of last refill: 01/13/21 ?Last refill amount: 30, 60 ? ? ?

## 2022-03-20 ENCOUNTER — Telehealth: Payer: Self-pay | Admitting: Registered Nurse

## 2022-03-20 DIAGNOSIS — F909 Attention-deficit hyperactivity disorder, unspecified type: Secondary | ICD-10-CM

## 2022-03-20 MED ORDER — AMPHETAMINE-DEXTROAMPHET ER 30 MG PO CP24: 30.0000 mg | ORAL_CAPSULE | ORAL | 0 refills | Status: DC

## 2022-03-20 NOTE — Telephone Encounter (Signed)
Patient called stating that her pharmacy does not have the generic adderall available but does have the brand name available and would need a new rx sent for the Adderall 30 mg XR for brand name to the [facility] on [address]. Can either of you take care of this in Richard's absence? Please and thanks!

## 2022-03-20 NOTE — Telephone Encounter (Signed)
Prescription for brand name medication sent to preferred pharmacy

## 2022-05-08 ENCOUNTER — Telehealth: Payer: Self-pay | Admitting: Registered Nurse

## 2022-05-08 DIAGNOSIS — F909 Attention-deficit hyperactivity disorder, unspecified type: Secondary | ICD-10-CM

## 2022-05-08 MED ORDER — AMPHETAMINE-DEXTROAMPHETAMINE 20 MG PO TABS: 20.0000 mg | ORAL_TABLET | Freq: Two times a day (BID) | ORAL | 0 refills | Status: DC

## 2022-05-08 MED ORDER — AMPHETAMINE-DEXTROAMPHET ER 30 MG PO CP24: 30.0000 mg | ORAL_CAPSULE | ORAL | 0 refills | Status: DC

## 2022-05-08 NOTE — Telephone Encounter (Signed)
Prescriptions refilled at pt request.  Pt will need to find a new provider ASAP to continue these medications as I don't prescribe twice daily short acting medication in addition to long acting daily medication.

## 2022-05-08 NOTE — Telephone Encounter (Signed)
Encourage patient to contact the pharmacy for refills or they can request refills through [facility]  (Please schedule appointment if patient has not been seen in over a year)    WHAT PHARMACY WOULD THEY LIKE THIS SENT TO: [facility] [phone]  MEDICATION NAME & DOSE: Adderall xr 30mg  AND adderall 20 mg.  NOTES/COMMENTS FROM PATIENT: Please send as 30 day supply with 2 refills (wants 90 day supply, but pharmacy will only fill as listed above)      Front office please notify patient: It takes 48-72 hours to process rx refill requests Ask patient to call pharmacy to ensure rx is ready before heading there.

## 2022-06-09 ENCOUNTER — Telehealth: Payer: Self-pay | Admitting: Registered Nurse

## 2022-06-09 DIAGNOSIS — F909 Attention-deficit hyperactivity disorder, unspecified type: Secondary | ICD-10-CM

## 2022-06-09 NOTE — Telephone Encounter (Signed)
Pt called in asking for a refill on the Adderall 30 mg xr and adderall 20 mg  pt uses [facility] on [address]

## 2022-06-10 MED ORDER — AMPHETAMINE-DEXTROAMPHET ER 30 MG PO CP24: 30.0000 mg | ORAL_CAPSULE | ORAL | 0 refills | Status: AC

## 2022-06-10 MED ORDER — AMPHETAMINE-DEXTROAMPHETAMINE 20 MG PO TABS: 20.0000 mg | ORAL_TABLET | Freq: Two times a day (BID) | ORAL | 0 refills | Status: AC

## 2022-06-10 NOTE — Telephone Encounter (Signed)
Informed pt that her Rx has been sent in and she will need to est care with a new provider

## 2022-06-10 NOTE — Telephone Encounter (Signed)
Prescription sent.  Last refill.  Pt needs new provider

## 2023-02-26 ENCOUNTER — Emergency Department (HOSPITAL_BASED_OUTPATIENT_CLINIC_OR_DEPARTMENT_OTHER): Payer: Self-pay

## 2023-02-26 ENCOUNTER — Encounter (HOSPITAL_BASED_OUTPATIENT_CLINIC_OR_DEPARTMENT_OTHER): Payer: Self-pay | Admitting: Emergency Medicine

## 2023-02-26 ENCOUNTER — Emergency Department (HOSPITAL_BASED_OUTPATIENT_CLINIC_OR_DEPARTMENT_OTHER)
Admission: EM | Admit: 2023-02-26 | Discharge: 2023-02-26 | Disposition: A | Discharge status: Home / Self Care | Payer: Self-pay | Attending: Emergency Medicine | Admitting: Emergency Medicine

## 2023-02-26 ENCOUNTER — Other Ambulatory Visit (HOSPITAL_BASED_OUTPATIENT_CLINIC_OR_DEPARTMENT_OTHER): Payer: Self-pay

## 2023-02-26 ENCOUNTER — Other Ambulatory Visit: Payer: Self-pay

## 2023-02-26 DIAGNOSIS — M79671 Pain in right foot: Secondary | ICD-10-CM | POA: Insufficient documentation

## 2023-02-26 MED ORDER — NAPROXEN 250 MG PO TABS
500.0000 mg | ORAL_TABLET | Freq: Once | ORAL | Status: AC
  Administered 2023-02-26: 500 mg via ORAL
  Filled 2023-02-26: qty 2

## 2023-02-26 NOTE — ED Triage Notes (Signed)
Pt arrives to ED with c/o right heel pain after falling.

## 2023-02-26 NOTE — ED Provider Notes (Signed)
[facility] AT [facility] Provider Note   CSN: [id number] Arrival date & time: 02/26/23  1427     History  Chief Complaint  Patient presents with   Foot Pain    Crystal Webb is a 47 y.o. female.  Patient presents to the emergency department complaining of right-sided heel pain.  Patient states that she was walking and was beginning to fall and landed hard with her weight on the right heel.  She denies any other injuries.  She complains of pain with weightbearing.  Past medical history noncontributory  HPI     Home Medications Prior to Admission medications   Medication Sig Start Date End Date Taking? Authorizing Provider  amphetamine-dextroamphetamine (ADDERALL XR) 30 MG 24 hr capsule Take 1 capsule (30 mg total) by mouth every morning. 06/10/22   Sheliah Hatch, MD  amphetamine-dextroamphetamine (ADDERALL) 20 MG tablet Take 1 tablet (20 mg total) by mouth 2 (two) times daily. 06/10/22   Sheliah Hatch, MD  venlafaxine XR (EFFEXOR-XR) 150 MG 24 hr capsule Take 1 capsule (150 mg total) by mouth daily with breakfast. 02/10/22   [name], NP      Allergies    Patient has no known allergies.    Review of Systems   Review of Systems  Physical Exam Updated Vital Signs BP (!) 145/87 (BP Location: Right Arm)   Pulse 95   Temp 98.4 F (36.9 C) (Oral)   Resp 18   SpO2 98%  Physical Exam Vitals and nursing note reviewed.  HENT:     Head: Normocephalic and atraumatic.  Eyes:     Conjunctiva/sclera: Conjunctivae normal.  Cardiovascular:     Rate and Rhythm: Normal rate.  Pulmonary:     Effort: Pulmonary effort is normal. No respiratory distress.  Musculoskeletal:        General: Tenderness and signs of injury present. No swelling or deformity.     Cervical back: Normal range of motion.     Comments: Tenderness to palpation of the right heel, no swelling noted  Skin:    General: Skin is dry.  Neurological:     Mental Status:  She is alert.  Psychiatric:        Speech: Speech normal.        Behavior: Behavior normal.     ED Results / Procedures / Treatments   Labs (all labs ordered are listed, but only abnormal results are displayed) Labs Reviewed - No data to display  EKG None  Radiology DG Foot Complete Right  Result Date: 02/26/2023 CLINICAL DATA:  Heel pain post fall EXAM: RIGHT FOOT COMPLETE - 3+ VIEW COMPARISON:  None Available. FINDINGS: There is no evidence of fracture or dislocation. There is no evidence of arthropathy or other focal bone abnormality. Soft tissues are unremarkable. IMPRESSION: Negative. Electronically Signed   By: Jasmine Pang M.D.   On: 02/26/2023 15:20    Procedures Procedures    Medications Ordered in ED Medications  naproxen (NAPROSYN) tablet 500 mg (has no administration in time range)    ED Course/ Medical Decision Making/ A&P                             Medical Decision Making Amount and/or Complexity of Data Reviewed Radiology: ordered.  Risk Prescription drug management.   This patient presents to the ED for concern of heel pain, this involves an extensive number of treatment options, and is a  complaint that carries with it a high risk of complications and morbidity.  The differential diagnosis includes fracture, dislocation, soft tissue injury, others   Co morbidities that complicate the patient evaluation  None   Imaging Studies ordered:  I ordered imaging studies including plain films of the right foot I independently visualized and interpreted imaging which showed no fracture or dislocation I agree with the radiologist interpretation   Problem List / ED Course / Critical interventions / Medication management   I ordered medication including Naprosyn for pain Reevaluation of the patient after these medicines showed that the patient improved I have reviewed the patients home medicines and have made adjustments as needed   Social  Determinants of Health:  Patient is self-pay   Test / Admission - Considered:  Patient has no fracture or dislocation on imaging.  Presentation is consistent with a contusion.  Patient is able to walk without difficulty, just with some pain in the right heel.  Plan to discharge home at this time with recommendations for wearing tennis shoes, over-the-counter medication such as ibuprofen and Tylenol.  Patient voices understanding with plan.  Orthopedic follow-up information provided to be used as needed if patient does not see improvement in symptoms.         Final Clinical Impression(s) / ED Diagnoses Final diagnoses:  Pain of right heel    Rx / DC Orders ED Discharge Orders     None         Pamala Duffel 02/26/23 1533    Blane Ohara, MD 02/26/23 1538

## 2023-02-26 NOTE — ED Notes (Signed)
Patient verbalizes understanding of discharge instructions. Opportunity for questioning and answers were provided. Patient discharged from ED.  °

## 2023-02-26 NOTE — Discharge Instructions (Addendum)
You were evaluated today for right-sided foot pain.  Your x-rays were reassuring for no signs of fracture or dislocation.  I recommend wearing tennis shoes for support.  You may take over-the-counter medications such as Tylenol and ibuprofen for pain control.  If your symptoms consider he may follow-up with orthopedic surgery.  I have attached contact information for the on-call orthopedic surgeon.
